# Patient Record
Sex: Female | Born: 1950 | Race: White | Hispanic: No | Marital: Single | Smoking: Former smoker
Health system: Southern US, Community
[De-identification: ages and names within clinical notes are randomized; demographics above are authoritative.]

## PROBLEM LIST (undated history)

## (undated) DIAGNOSIS — J449 Chronic obstructive pulmonary disease, unspecified: Secondary | ICD-10-CM

## (undated) DIAGNOSIS — F419 Anxiety disorder, unspecified: Secondary | ICD-10-CM

## (undated) DIAGNOSIS — E039 Hypothyroidism, unspecified: Secondary | ICD-10-CM

## (undated) DIAGNOSIS — IMO0001 Reserved for inherently not codable concepts without codable children: Secondary | ICD-10-CM

## (undated) DIAGNOSIS — E119 Type 2 diabetes mellitus without complications: Secondary | ICD-10-CM

## (undated) DIAGNOSIS — Z794 Long term (current) use of insulin: Secondary | ICD-10-CM

## (undated) DIAGNOSIS — R0789 Other chest pain: Secondary | ICD-10-CM

## (undated) DIAGNOSIS — E785 Hyperlipidemia, unspecified: Secondary | ICD-10-CM

## (undated) DIAGNOSIS — I2510 Atherosclerotic heart disease of native coronary artery without angina pectoris: Secondary | ICD-10-CM

## (undated) DIAGNOSIS — Z9002 Acquired absence of larynx: Secondary | ICD-10-CM

## (undated) DIAGNOSIS — Z87891 Personal history of nicotine dependence: Secondary | ICD-10-CM

## (undated) HISTORY — PX: OTHER SURGICAL HISTORY: SHX169

## (undated) HISTORY — DX: Atherosclerotic heart disease of native coronary artery without angina pectoris: I25.10

## (undated) HISTORY — DX: Type 2 diabetes mellitus without complications: E11.9

## (undated) HISTORY — DX: Hyperlipidemia, unspecified: E78.5

## (undated) HISTORY — DX: Personal history of nicotine dependence: Z87.891

## (undated) HISTORY — PX: LARYNGECTOMY: SUR815

## (undated) HISTORY — DX: Chronic obstructive pulmonary disease, unspecified: J44.9

## (undated) HISTORY — DX: Long term (current) use of insulin: Z79.4

## (undated) HISTORY — DX: Acquired absence of larynx: Z90.02

## (undated) HISTORY — DX: Other chest pain: R07.89

## (undated) HISTORY — DX: Hypothyroidism, unspecified: E03.9

## (undated) HISTORY — DX: Reserved for inherently not codable concepts without codable children: IMO0001

## (undated) HISTORY — DX: Anxiety disorder, unspecified: F41.9

---

## 1998-04-16 ENCOUNTER — Inpatient Hospital Stay (HOSPITAL_COMMUNITY): Admission: AD | Admit: 1998-04-16 | Discharge: 1998-04-17 | Payer: Self-pay | Admitting: *Deleted

## 2001-08-30 ENCOUNTER — Ambulatory Visit (HOSPITAL_COMMUNITY): Admission: RE | Admit: 2001-08-30 | Discharge: 2001-08-30 | Payer: Self-pay | Admitting: General Surgery

## 2001-08-30 ENCOUNTER — Encounter: Payer: Self-pay | Admitting: General Surgery

## 2003-03-06 ENCOUNTER — Encounter: Payer: Self-pay | Admitting: General Surgery

## 2003-03-06 ENCOUNTER — Ambulatory Visit (HOSPITAL_COMMUNITY): Admission: RE | Admit: 2003-03-06 | Discharge: 2003-03-06 | Payer: Self-pay | Admitting: General Surgery

## 2003-03-29 ENCOUNTER — Ambulatory Visit (HOSPITAL_COMMUNITY): Admission: RE | Admit: 2003-03-29 | Discharge: 2003-03-29 | Payer: Self-pay | Admitting: Internal Medicine

## 2004-09-18 ENCOUNTER — Ambulatory Visit: Payer: Self-pay | Admitting: Cardiology

## 2004-09-23 ENCOUNTER — Ambulatory Visit: Payer: Self-pay | Admitting: Cardiology

## 2005-03-04 ENCOUNTER — Ambulatory Visit (HOSPITAL_COMMUNITY): Admission: RE | Admit: 2005-03-04 | Discharge: 2005-03-04 | Payer: Self-pay | Admitting: Internal Medicine

## 2005-03-04 ENCOUNTER — Ambulatory Visit: Payer: Self-pay | Admitting: Internal Medicine

## 2005-03-20 ENCOUNTER — Ambulatory Visit: Payer: Self-pay | Admitting: Cardiology

## 2005-04-10 ENCOUNTER — Inpatient Hospital Stay (HOSPITAL_BASED_OUTPATIENT_CLINIC_OR_DEPARTMENT_OTHER): Admission: RE | Admit: 2005-04-10 | Discharge: 2005-04-10 | Payer: Self-pay | Admitting: Cardiology

## 2005-04-10 ENCOUNTER — Ambulatory Visit (HOSPITAL_COMMUNITY): Admission: RE | Admit: 2005-04-10 | Discharge: 2005-04-11 | Payer: Self-pay | Admitting: Cardiology

## 2005-04-10 ENCOUNTER — Ambulatory Visit: Payer: Self-pay | Admitting: Cardiology

## 2005-04-20 ENCOUNTER — Ambulatory Visit: Payer: Self-pay | Admitting: Cardiology

## 2005-04-21 ENCOUNTER — Ambulatory Visit: Payer: Self-pay | Admitting: Cardiology

## 2005-04-28 ENCOUNTER — Ambulatory Visit: Payer: Self-pay | Admitting: Cardiology

## 2005-08-12 ENCOUNTER — Ambulatory Visit: Payer: Self-pay | Admitting: *Deleted

## 2005-08-12 ENCOUNTER — Inpatient Hospital Stay (HOSPITAL_COMMUNITY): Admission: AD | Admit: 2005-08-12 | Discharge: 2005-08-14 | Payer: Self-pay | Admitting: Internal Medicine

## 2005-08-17 ENCOUNTER — Ambulatory Visit: Payer: Self-pay | Admitting: Cardiology

## 2005-08-19 ENCOUNTER — Ambulatory Visit: Payer: Self-pay | Admitting: Cardiology

## 2005-09-04 ENCOUNTER — Ambulatory Visit (HOSPITAL_COMMUNITY): Admission: RE | Admit: 2005-09-04 | Discharge: 2005-09-04 | Payer: Self-pay | Admitting: Pulmonary Disease

## 2005-12-22 ENCOUNTER — Ambulatory Visit: Payer: Self-pay | Admitting: Cardiology

## 2006-02-17 ENCOUNTER — Ambulatory Visit: Payer: Self-pay | Admitting: Cardiology

## 2006-04-21 ENCOUNTER — Ambulatory Visit (HOSPITAL_COMMUNITY): Admission: RE | Admit: 2006-04-21 | Discharge: 2006-04-21 | Payer: Self-pay | Admitting: Otolaryngology

## 2006-05-18 ENCOUNTER — Ambulatory Visit (HOSPITAL_COMMUNITY): Admission: RE | Admit: 2006-05-18 | Discharge: 2006-05-18 | Payer: Self-pay | Admitting: Pulmonary Disease

## 2006-06-18 ENCOUNTER — Ambulatory Visit: Payer: Self-pay | Admitting: Cardiology

## 2006-08-17 ENCOUNTER — Ambulatory Visit: Payer: Self-pay | Admitting: Cardiology

## 2006-09-20 ENCOUNTER — Ambulatory Visit (HOSPITAL_COMMUNITY): Admission: RE | Admit: 2006-09-20 | Discharge: 2006-09-20 | Payer: Self-pay | Admitting: Pulmonary Disease

## 2006-10-04 ENCOUNTER — Ambulatory Visit: Payer: Self-pay | Admitting: Internal Medicine

## 2006-10-11 ENCOUNTER — Ambulatory Visit (HOSPITAL_COMMUNITY): Admission: RE | Admit: 2006-10-11 | Discharge: 2006-10-11 | Payer: Self-pay | Admitting: Internal Medicine

## 2006-10-11 ENCOUNTER — Ambulatory Visit: Payer: Self-pay | Admitting: Internal Medicine

## 2006-10-11 ENCOUNTER — Encounter (INDEPENDENT_AMBULATORY_CARE_PROVIDER_SITE_OTHER): Payer: Self-pay | Admitting: Specialist

## 2006-10-26 ENCOUNTER — Encounter (HOSPITAL_COMMUNITY): Admission: RE | Admit: 2006-10-26 | Discharge: 2006-11-25 | Payer: Self-pay | Admitting: Pulmonary Disease

## 2006-10-26 ENCOUNTER — Ambulatory Visit (HOSPITAL_COMMUNITY): Payer: Self-pay | Admitting: Pulmonary Disease

## 2006-11-26 ENCOUNTER — Ambulatory Visit: Payer: Self-pay | Admitting: Cardiology

## 2006-12-01 ENCOUNTER — Encounter (INDEPENDENT_AMBULATORY_CARE_PROVIDER_SITE_OTHER): Payer: Self-pay | Admitting: Otolaryngology

## 2006-12-01 ENCOUNTER — Ambulatory Visit (HOSPITAL_COMMUNITY): Admission: RE | Admit: 2006-12-01 | Discharge: 2006-12-01 | Payer: Self-pay | Admitting: Otolaryngology

## 2007-01-06 ENCOUNTER — Ambulatory Visit (HOSPITAL_COMMUNITY): Admission: RE | Admit: 2007-01-06 | Discharge: 2007-01-06 | Payer: Self-pay | Admitting: Otolaryngology

## 2007-01-21 ENCOUNTER — Ambulatory Visit: Payer: Self-pay | Admitting: Cardiology

## 2007-04-13 ENCOUNTER — Ambulatory Visit: Admission: RE | Admit: 2007-04-13 | Discharge: 2007-06-27 | Payer: Self-pay | Admitting: Radiation Oncology

## 2007-06-15 ENCOUNTER — Encounter: Payer: Self-pay | Admitting: Cardiology

## 2007-07-04 ENCOUNTER — Ambulatory Visit: Payer: Self-pay | Admitting: Cardiology

## 2007-07-07 ENCOUNTER — Encounter: Payer: Self-pay | Admitting: Cardiology

## 2007-09-15 ENCOUNTER — Ambulatory Visit (HOSPITAL_COMMUNITY): Admission: RE | Admit: 2007-09-15 | Discharge: 2007-09-15 | Payer: Self-pay | Admitting: General Surgery

## 2007-10-21 ENCOUNTER — Ambulatory Visit: Payer: Self-pay | Admitting: Cardiology

## 2007-10-21 ENCOUNTER — Encounter: Payer: Self-pay | Admitting: Physician Assistant

## 2007-10-27 IMAGING — CT CT NECK W/ CM
3 series · 12 of 33 positions shown, 14 images · IV contrast (omnipaque)
Comparison: none

CLINICAL DATA: Recent diagnosis of laryngeal cancer.  Hoarseness, cough. Surgery on 12/01/06.
NECK CT WITH CONTRAST:
TECHNIQUE: Multidetector CT imaging of the neck was performed following the standard protocol during administration of intravenous contrast.
Contrast:  100 cc Omnipaque 300.

[Series 2: neck 3.0 b31s · axial · 0.39mm/px · z∈[-108,+36]mm · 4 of 68 slices shown, 5 images]
[im 11/68  soft-tissue]
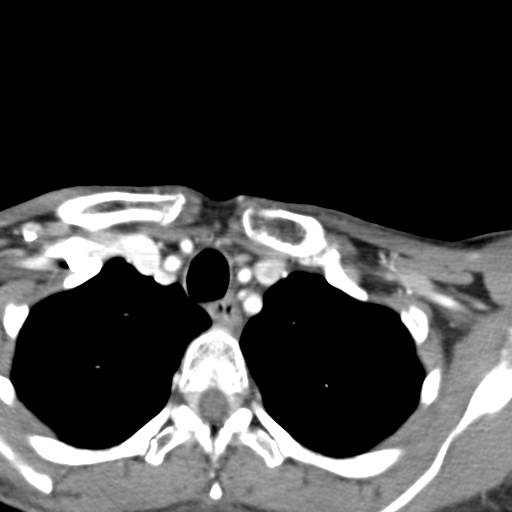
[im 11/68  bone]
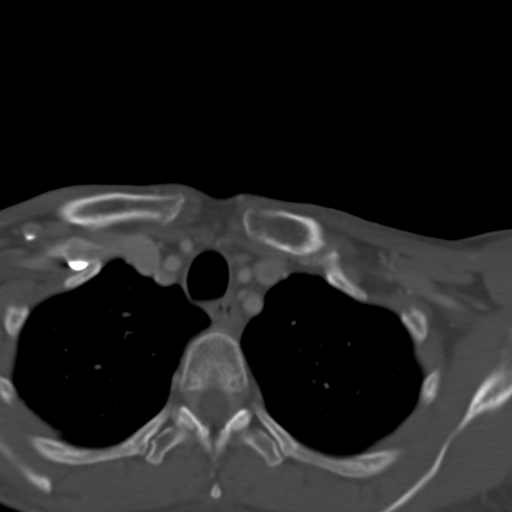
[im 26/68  bone]
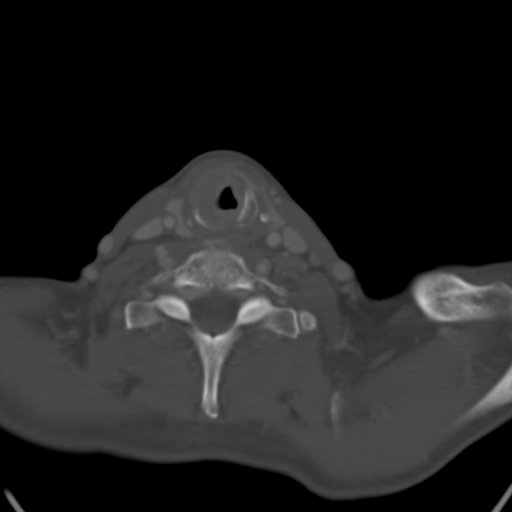
[im 42/68  bone]
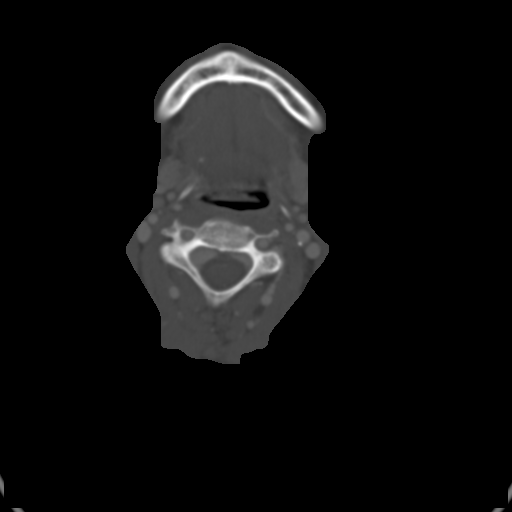
[im 57/68  bone]
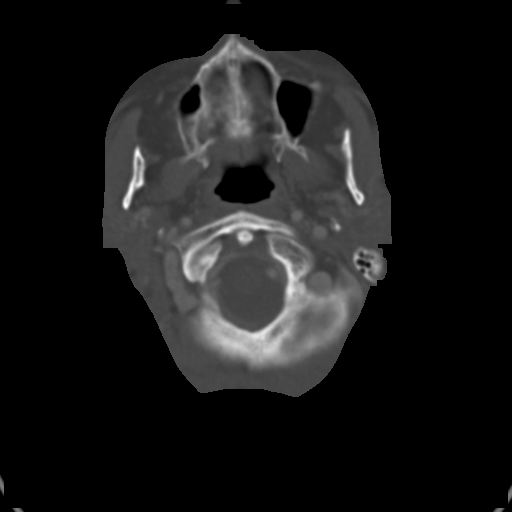

[Series 4: neck 3.0 spo cor · coronal · 0.22mm/px · 3 of 38 slices shown]
[im 8/38  bone]
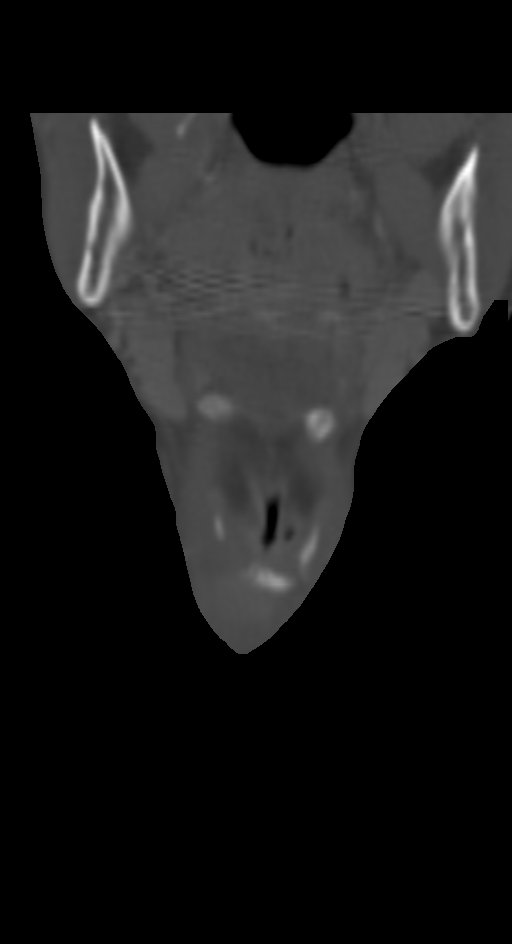
[im 15/38  bone]
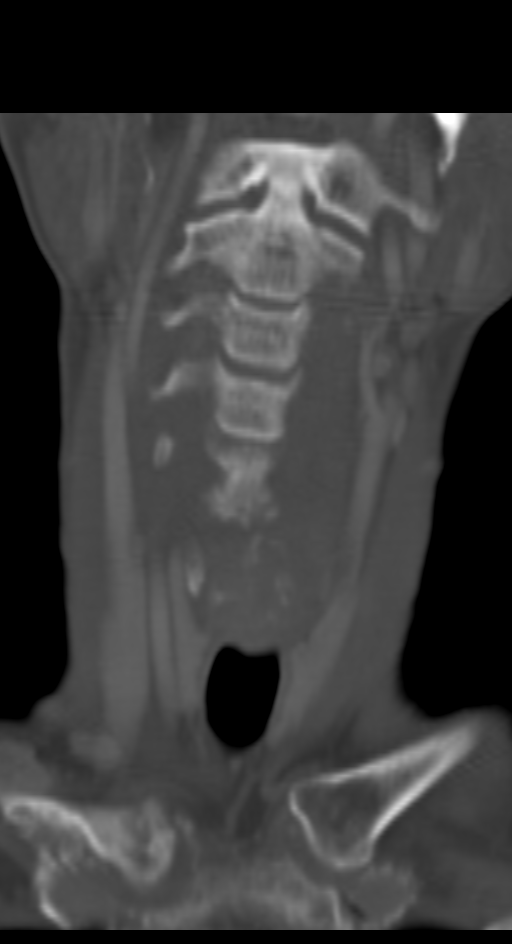
[im 23/38  bone]
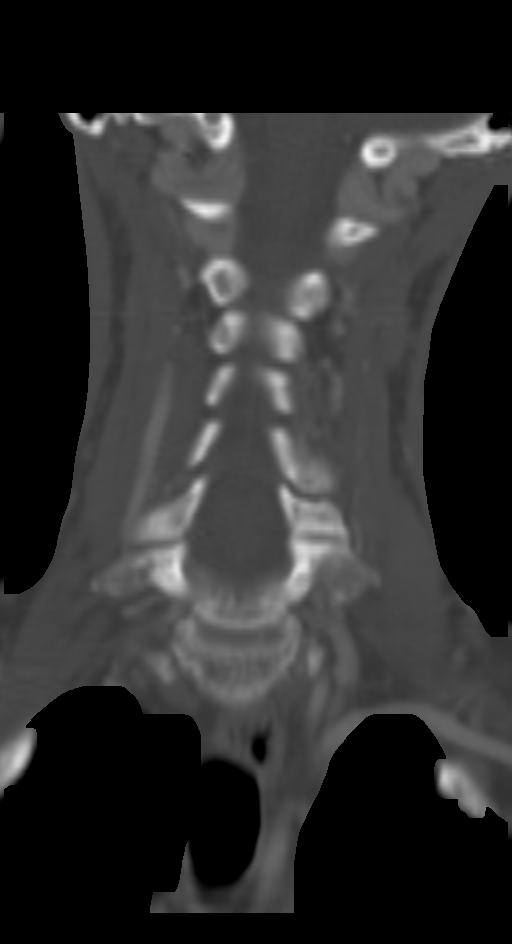

[Series 5: neck 3.0 spo sag · sagittal · 0.30mm/px · 5 of 45 slices shown, 6 images]
[im 15/45  bone]
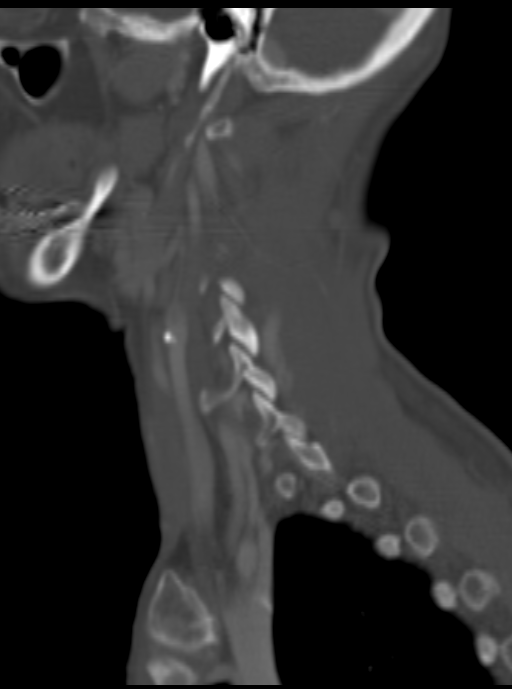
[im 19/45  bone]
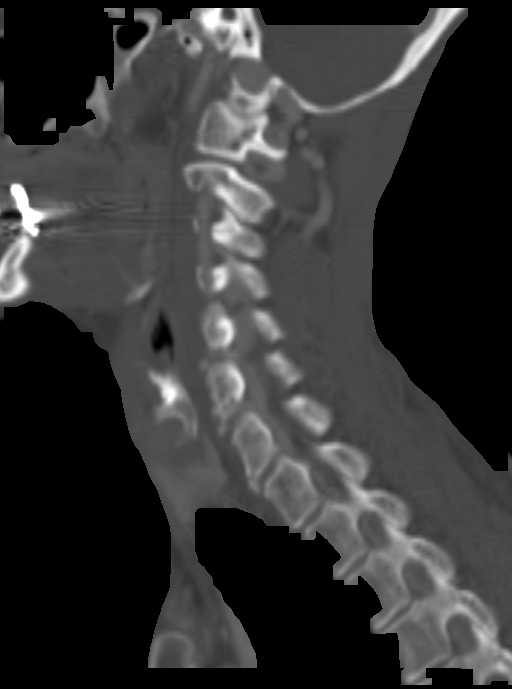
[im 23/45  soft-tissue]
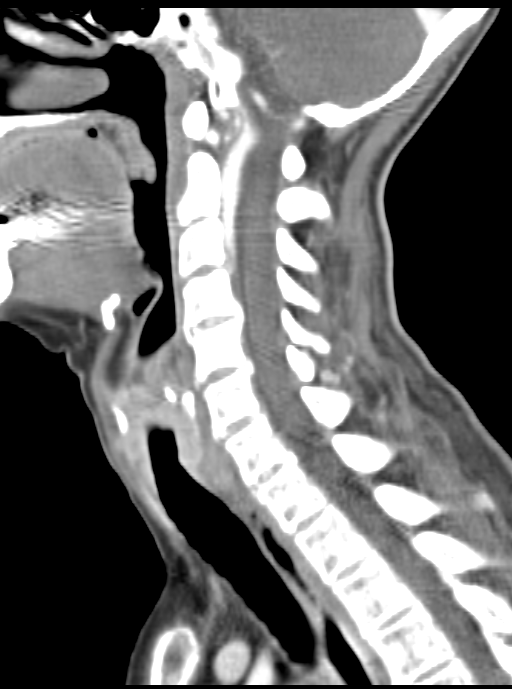
[im 23/45  bone]
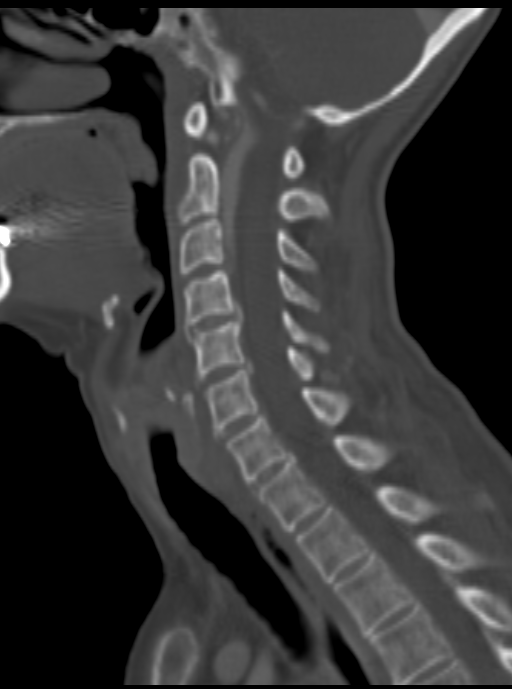
[im 26/45  bone]
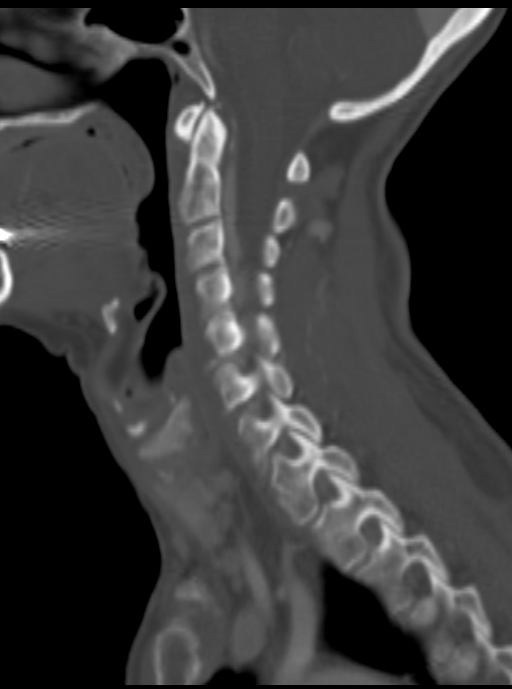
[im 30/45  bone]
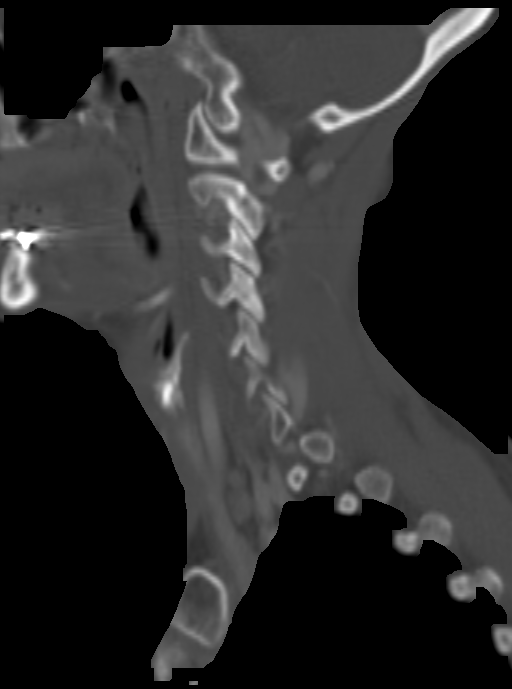

[12 of 33 positions shown; findings below may reference images not displayed]

FINDINGS: Visualized intracranial structures are unremarkable.  Paranasal sinuses, middle ears and mastoids are clear. Parotid glands and submandibular glands are normal. Thyroid gland is normal.  Lung apices show mild emphysematous changes without a focal lesion.
I do not see any enlarged lymph nodes on either side of the neck.  There is a 7 x 11 mm node on the left just lateral to the carotid artery.  On the right at this same level, there is a 6 x 11 mm lymph node.
At the glottic to subglottic region, there is abnormal thickening in the mucosal and submucosal region which is nearly circumferential.  Certainly this extends from [DATE] around clockwise to [DATE].  This includes the anterior commissure obviously.  Cricoid cartilage on the right appears to be destroyed.  I think there is probable invasion of the arytenoid on the right as well. The inferior thyroid cartilage is also involved.  The cephalocaudal extent of the tumor is measured at 3.2 cm.  
In the supraclavicular region in the low left neck, there is a lymph node that measure at 6 x 10 mm.  This is seen on image 53.
At the tongue base on the right on image 17 and 18, there is some asymmetric soft tissue density relative to the left. This could just be tonsillar tissue but direct inspection would be suggested to rule out a second primary.
IMPRESSION: 1.  Large (3 cm) glottic to subglottic tumor primarily on the right but extending nearly circumferentially from [DATE] to [DATE] in a clockwise direction. Definite destruction of the right side of the cricoid cartilage with involvement also the right side of the arytenoid and the right side of the thyroid cartilage.
2.  No pathologically enlarged nodes identified.  Largest lymph nodes in the neck are on the order of 6 x 12 mm in size which do not quality for pathologic enlargement. No sign of low density within them.
3.  Soft tissue prominence at the lingual tonsil region on the right. This may simply be asymmetric tonsillar tissue but direct inspection would be suggested to make sure we are not dealing with a synchronous primary.

## 2008-02-29 ENCOUNTER — Encounter: Payer: Self-pay | Admitting: Cardiology

## 2008-05-17 ENCOUNTER — Ambulatory Visit: Payer: Self-pay | Admitting: Cardiology

## 2008-07-04 ENCOUNTER — Encounter: Payer: Self-pay | Admitting: Cardiology

## 2008-07-05 ENCOUNTER — Ambulatory Visit: Payer: Self-pay | Admitting: Cardiology

## 2008-07-21 ENCOUNTER — Ambulatory Visit: Payer: Self-pay | Admitting: Cardiology

## 2008-07-30 ENCOUNTER — Encounter: Payer: Self-pay | Admitting: Cardiology

## 2008-10-22 ENCOUNTER — Ambulatory Visit: Payer: Self-pay | Admitting: Cardiology

## 2009-04-01 DIAGNOSIS — R079 Chest pain, unspecified: Secondary | ICD-10-CM | POA: Insufficient documentation

## 2009-04-01 DIAGNOSIS — E785 Hyperlipidemia, unspecified: Secondary | ICD-10-CM | POA: Insufficient documentation

## 2009-04-01 DIAGNOSIS — I2510 Atherosclerotic heart disease of native coronary artery without angina pectoris: Secondary | ICD-10-CM | POA: Insufficient documentation

## 2009-04-15 ENCOUNTER — Encounter: Payer: Self-pay | Admitting: Cardiology

## 2009-04-18 ENCOUNTER — Encounter: Payer: Self-pay | Admitting: Physician Assistant

## 2009-04-30 ENCOUNTER — Encounter: Payer: Self-pay | Admitting: Cardiology

## 2009-04-30 DIAGNOSIS — C329 Malignant neoplasm of larynx, unspecified: Secondary | ICD-10-CM

## 2009-04-30 DIAGNOSIS — I259 Chronic ischemic heart disease, unspecified: Secondary | ICD-10-CM | POA: Insufficient documentation

## 2009-04-30 DIAGNOSIS — J4489 Other specified chronic obstructive pulmonary disease: Secondary | ICD-10-CM | POA: Insufficient documentation

## 2009-04-30 DIAGNOSIS — J449 Chronic obstructive pulmonary disease, unspecified: Secondary | ICD-10-CM

## 2009-04-30 DIAGNOSIS — R0789 Other chest pain: Secondary | ICD-10-CM

## 2009-05-01 ENCOUNTER — Ambulatory Visit: Payer: Self-pay | Admitting: Cardiology

## 2009-09-16 ENCOUNTER — Ambulatory Visit: Payer: Self-pay | Admitting: Cardiology

## 2010-05-05 ENCOUNTER — Encounter: Payer: Self-pay | Admitting: Cardiology

## 2010-05-05 ENCOUNTER — Ambulatory Visit: Payer: Self-pay | Admitting: Cardiology

## 2010-05-15 ENCOUNTER — Encounter: Payer: Self-pay | Admitting: Orthopedic Surgery

## 2010-07-09 ENCOUNTER — Telehealth (INDEPENDENT_AMBULATORY_CARE_PROVIDER_SITE_OTHER): Payer: Self-pay | Admitting: *Deleted

## 2010-07-19 ENCOUNTER — Encounter: Payer: Self-pay | Admitting: Pulmonary Disease

## 2010-07-20 ENCOUNTER — Encounter: Payer: Self-pay | Admitting: *Deleted

## 2010-07-20 ENCOUNTER — Encounter: Payer: Self-pay | Admitting: General Surgery

## 2010-07-20 ENCOUNTER — Encounter: Payer: Self-pay | Admitting: Pulmonary Disease

## 2010-07-29 NOTE — Assessment & Plan Note (Signed)
Summary: [facility]      Allergies Added:   Visit Type:  Follow-up Primary Azhar Yogi:  Juanetta Gosling   History of Present Illness: the patient is a 60 year old female with laryngeal cancer status post tracheotomy. She is a history of single-vessel coronary arterydiseas with stent to the right coronary artery 2007. She had a nonischemic Cardiolite in 2008. She has done very well from a cardiac perspective. She denies any substernal chest pain or exertional dyspnea. She does cough a lot with her  tracheostomy but denies any fever chills or yellow phlegm. She is unable to take statins secondary to side effects. Her cardiovascular standpoint has otherwise remained stable. The patient is declining a stress test  at the present time.  Preventive Screening-Counseling & Management  Alcohol-Tobacco     Smoking Status: quit     Year Quit: 2008  Current Medications (verified): 1)  Metoprolol Succinate 25 Mg Xr24h-Tab (Metoprolol Succinate) .... Take One Tablet By Mouth Daily As Needed 2)  Nexium 20 Mg Cpdr (Esomeprazole Magnesium) .... Take 1 Tablet By Mouth Once A Day 3)  Alprazolam 0.5 Mg Tabs (Alprazolam) .... Take 1 Tablet By Mouth 5  Times A Day As Needed 4)  Humulin N 100 Unit/ml Susp (Insulin Isophane Human) .... Use As Directed 5)  Aspirin 81 Mg Tbec (Aspirin) .... Take 1 Tablet By Mouth Once A Day  Allergies (verified): 1)  ! Pcn 2)  ! Codeine 3)  ! Plavix (Clopidogrel Bisulfate) 4)  ! Lipitor (Atorvastatin)  Comments:  Nurse/Medical Assistant: The patient is currently on medications but does not know the name or dosage at this time. Instructed to contact our office with details. Will update medication list at that time.  Past History:  Past Medical History: Last updated: 04/30/2009  1. Atypical chest pain, likely musculoskeletal.   2. Single-vessel coronary artery disease.       a.     Status post Cypher stent of the right coronary artery        October 2006 and patent by  catheterization October 2007.       b.     Normal left ventricular function.       c.     Normal exercise Cardiolite study of 67% May 2008.   3. Chronic obstructive pulmonary disease and history of tobacco use,       discontinued.   4. Dyslipidemia.   5. Insulin-dependent diabetes mellitus.   6. 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   7. Anxiety disorder.   8. Hypothyroidism.   Past Surgical History: Last updated: 04/01/2009 laryngectomy permanent tracheostomy  Family History: Last updated: 05/01/2009 Negative FH of Diabetes, Hypertension, or Coronary Artery Disease  Social History: Last updated: 04/01/2009 Single  Tobacco Use - No.   Risk Factors: Smoking Status: quit (05/05/2010)  Review of Systems       The patient complains of prolonged cough.  The patient denies fatigue, malaise, fever, weight gain/loss, vision loss, decreased hearing, hoarseness, chest pain, palpitations, shortness of breath, wheezing, sleep apnea, coughing up blood, abdominal pain, blood in stool, nausea, vomiting, diarrhea, heartburn, incontinence, blood in urine, muscle weakness, joint pain, leg swelling, rash, skin lesions, headache, fainting, dizziness, depression, anxiety, enlarged lymph nodes, easy bruising or bleeding, and environmental allergies.    Vital Signs:  Patient profile:   60 year old female Height:      62 inches Weight:      97 pounds BMI:  17.81 Pulse rate:   93 / minute BP sitting:   153 / 89  (left arm) Cuff size:   regular  Vitals Entered By: Carlye Grippe (May 05, 2010 1:22 PM)  Physical Exam  Additional Exam:  General: Well-developed, well-nourished in no distress head: Normocephalic and atraumatic eyes PERRLA/EOMI intact, conjunctiva and lids normal nose: No deformity or lesions mouth normal dentition, normal posterior pharynx neck: Supple, no JVD.  No masses, thyromegaly or abnormal cervical nodes. Status  post tracheotomy. lungs: Normal breath sounds bilaterally without wheezing.  Normal percussion heart: regular rate and rhythm with normal S1 and S2, no S3 or S4.  PMI is normal.  No pathological murmurs abdomen: Normal bowel sounds, abdomen is soft and nontender without masses, organomegaly or hernias noted.  No hepatosplenomegaly musculoskeletal: Back normal, normal gait muscle strength and tone normal pulsus: Pulse is normal in all 4 extremities Extremities: No peripheral pitting edema neurologic: Alert and oriented x 3 skin: Intact without lesions or rashes cervical nodes: No significant adenopathy psychologic: Normal affect    EKG  Procedure date:  05/05/2010  Findings:      normal sinus rhythm no acute ischemic changes.  Impression & Recommendations:  Problem # 1:  COPD (ICD-496) stable. Chronic cough but no active infection  Problem # 2:  TRACHEOSTOMY STATUS (ICD-V44.0) Assessment: Comment Only  Problem # 3:  CORONARY ARTERY DISEASE, S/P PTCA (ICD-414.9) the patient status post prior stenting for single vessel disease. She denies any chest pain. She's due for ischemia testing but the patient wants to hold off. She cannot take statin drug therapy. Her updated medication list for this problem includes:    Metoprolol Succinate 25 Mg Xr24h-tab (Metoprolol succinate) .Marland Kitchen... Take one tablet by mouth daily as needed    Aspirin 81 Mg Tbec (Aspirin) .Marland Kitchen... Take 1 tablet by mouth once a day  Other Orders: EKG w/ Interpretation (93000)  Patient Instructions: 1)  Your physician recommends that you continue on your current medications as directed. Please refer to the Current Medication list given to you today. 2)  Follow up in  6 months

## 2010-07-29 NOTE — Assessment & Plan Note (Signed)
Summary: 3 month fu -  Medications Added ALPRAZOLAM 0.5 MG TABS (ALPRAZOLAM) Take 1 tablet by mouth 5  times a day as needed ASPIRIN 81 MG TBEC (ASPIRIN) Take 1 tablet by mouth once a day        Visit Type:  Follow-up Primary Provider:  Juanetta Gosling  CC:  follow-up visit.  History of Present Illness: the patient's 60-year-old female status post tracheotomy secondary to laryngeal cancer. She also has single-vessel coronary disease with stent of right coronary artery. Stent was patent in 2007 and she had a nonischemic Cardiolite in 2008. The patient reports nosubsternal chest pain short of breath and PND.she does have productive nonpurulent cough which is chronic. Unfortunately she stopped taking her aspirin because of stomach upset. Chest significant reflux also. She reports no exertional chest pain. She feels panicky today. She feels it's too hot in the office and this makes her anxious.  Preventive Screening-Counseling & Management  Alcohol-Tobacco     Smoking Status: quit     Year Quit: 2008  Clinical Review Panels:  CXR CXR results Cardiac silhouette is upper normal size.  Along the left         side of the inferior cardiac margin is an oval opacity which is         felt represent the nipple density.  No pulmonary edema, pneumonia,         or pleural effusion is seen.  Nonaneurysmal aortic calcification is         present.  No bony lesions are demonstrated.                   IMPRESSION:         No acute or active process is seen. (07/04/2008)    Current Medications (verified): 1)  Metoprolol Succinate 25 Mg Xr24h-Tab (Metoprolol Succinate) .... Take One Tablet By Mouth Daily As Needed 2)  Nexium 20 Mg Cpdr (Esomeprazole Magnesium) .... Take 1 Tablet By Mouth Once A Day 3)  Alprazolam 0.5 Mg Tabs (Alprazolam) .... Take 1 Tablet By Mouth 5  Times A Day As Needed 4)  Humulin N 100 Unit/ml Susp (Insulin Isophane Human) .... Use As Directed 5)  Aspirin 81 Mg Tbec (Aspirin) .... Take  1 Tablet By Mouth Once A Day  Allergies: 1)  ! Pcn 2)  ! Codeine 3)  ! Plavix (Clopidogrel Bisulfate) 4)  ! Lipitor (Atorvastatin)  Comments:  Nurse/Medical Assistant: The patient's medications and allergies were reviewed with the patient and were updated in the Medication and Allergy Lists. Youlanda Mighty RN (September 16, 2009 3:12 PM)  Past History:  Past Medical History: Last updated: 04/30/2009  1. Atypical chest pain, likely musculoskeletal.   2. Single-vessel coronary artery disease.       a.     Status post Cypher stent of the right coronary artery        October 2006 and patent by catheterization October 2007.       b.     Normal left ventricular function.       c.     Normal exercise Cardiolite study of 67% May 2008.   3. Chronic obstructive pulmonary disease and history of tobacco use,       discontinued.   4. Dyslipidemia.   5. Insulin-dependent diabetes mellitus.   6. 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   7. Anxiety disorder.   8. Hypothyroidism.   Past Surgical History: Last  updated: 04/01/2009 laryngectomy permanent tracheostomy  Family History: Last updated: 05/01/2009 Negative FH of Diabetes, Hypertension, or Coronary Artery Disease  Social History: Last updated: 04/01/2009 Single  Tobacco Use - No.   Risk Factors: Smoking Status: quit (09/16/2009)  Review of Systems       The patient complains of prolonged cough.  The patient denies fatigue, malaise, fever, weight gain/loss, vision loss, decreased hearing, hoarseness, chest pain, palpitations, shortness of breath, wheezing, sleep apnea, coughing up blood, abdominal pain, blood in stool, nausea, vomiting, diarrhea, heartburn, incontinence, blood in urine, muscle weakness, joint pain, leg swelling, rash, skin lesions, headache, fainting, dizziness, depression, anxiety, enlarged lymph nodes, easy bruising or bleeding, and environmental allergies.     Vital Signs:  Patient profile:   60 year old female Height:      62 inches Weight:      97 pounds O2 Sat:      97 % Pulse rate:   103 / minute BP sitting:   135 / 78  (left arm) Cuff size:   regular  Vitals Entered By: Youlanda Mighty RN (September 16, 2009 3:10 PM) CC: follow-up visit   Physical Exam  Additional Exam:  General: Well-developed, well-nourished in no distress head: Normocephalic and atraumatic eyes PERRLA/EOMI intact, conjunctiva and lids normal nose: No deformity or lesions mouth normal dentition, normal posterior pharynx neck: Supple, no JVD.  No masses, thyromegaly or abnormal cervical nodes. Status post tracheotomy. lungs: Normal breath sounds bilaterally without wheezing.  Normal percussion heart: regular rate and rhythm with normal S1 and S2, no S3 or S4.  PMI is normal.  No pathological murmurs abdomen: Normal bowel sounds, abdomen is soft and nontender without masses, organomegaly or hernias noted.  No hepatosplenomegaly musculoskeletal: Back normal, normal gait muscle strength and tone normal pulsus: Pulse is normal in all 4 extremities Extremities: No peripheral pitting edema neurologic: Alert and oriented x 3 skin: Intact without lesions or rashes cervical nodes: No significant adenopathy psychologic: Normal affect    Impression & Recommendations:  Problem # 1:  TRACHEOSTOMY STATUS (ICD-V44.0) Assessment Comment Only  Problem # 2:  CHEST PAIN, ATYPICAL (ICD-786.59) Assessment: Comment Only  The following medications were removed from the medication list:    Aspirin 81 Mg Tbec (Aspirin) .Marland Kitchen... Take 1 tablet by mouth once a day Her updated medication list for this problem includes:    Metoprolol Succinate 25 Mg Xr24h-tab (Metoprolol succinate) .Marland Kitchen... Take one tablet by mouth daily as needed    Aspirin 81 Mg Tbec (Aspirin) .Marland Kitchen... Take 1 tablet by mouth once a day  Problem # 3:  CORONARY ARTERY DISEASE, S/P PTCA (ICD-414.9) the patient stopped  taking aspirin. I told her she cannot discontinued this and recommended enteric-coated aspirin 81 mg a day. I explained to her that she's otherwise at high risk for late stent thrombosis. I also told her that she can increase Nexium to twice a day if needed The following medications were removed from the medication list:    Aspirin 81 Mg Tbec (Aspirin) .Marland Kitchen... Take 1 tablet by mouth once a day Her updated medication list for this problem includes:    Metoprolol Succinate 25 Mg Xr24h-tab (Metoprolol succinate) .Marland Kitchen... Take one tablet by mouth daily as needed    Aspirin 81 Mg Tbec (Aspirin) .Marland Kitchen... Take 1 tablet by mouth once a day  Problem # 4:  HYPERLIPIDEMIA-MIXED (ICD-272.4) Assessment: Comment Only  Patient Instructions: 1)  Need to continue the Enteric Coated Aspirin 81mg  daily 2)  Follow up in  6 months.

## 2010-07-29 NOTE — Letter (Signed)
Summary: Medical record reques [employer]  Medical record reques [employer]   Imported By: Cammie Sickle 05/23/2010 13:57:17  _____________________________________________________________________  External Attachment:    Type:   Image     Comment:   External Document

## 2010-08-01 ENCOUNTER — Encounter: Payer: Self-pay | Admitting: Cardiology

## 2010-08-06 NOTE — Progress Notes (Signed)
Summary: ?? stress test or echo       Phone Note Call from Patient   Summary of Call: Seen last in November and MD suggested ishemia testing, but patient declined to do at that time.  Patient states that she has had a couple of pains over left chest.  Stated that she did see Dr. Juanetta Gosling and he thought that it was muscle related.  Wants to know if echo would be okay for her to have instead of stress test.  Worried about testing not being good because of her having sooo much anxiety leading up to test.  Thinks heart rate will be too fast.   Initial call taken by: Hoover Brunette, LPN,  July 09, 2010 4:32 PM  Follow-up for Phone Call        she really needs a stress test. The Lexiscan should not give her an increased heart rate and is a very short procedure. Would recommend we ordered this Follow-up by: Lewayne Bunting, MD, [facility],  July 09, 2010 6:04 PM  Additional Follow-up for Phone Call Additional follow up Details #1::        Left message to return call.      Additional Follow-up for Phone Call Additional follow up Details #2::    patient returned your call. Follow-up by: Carlye Grippe,  July 15, 2010 12:50 PM  Additional Follow-up for Phone Call Additional follow up Details #3:: Details for Additional Follow-up Action Taken: Left message to return call.  Hoover Brunette, LPN  July 22, 2010 3:11 PM  Patient notified.  States that she does not want to have stress test with drug (Lexiscan).  She will do the walking on the treadmill.  Her concern over her elevated heart rate & BP is not only during the test, but just the anxiety of getting there.   She c/o of having extreme anxiety when she goes out anywhere.  Want pt to come in for OV to discuss further?   exercise Cardiolite is fine provided that she holds beta-blocker 48 hours before testing.  She could also do a dobutamine echocardiogram and again beta-blocker will need to be held 48 hours prior. Lewayne Bunting, MD, [facility]  July 28, 2010  1:23 PM  Additional Follow-up by: Hoover Brunette, LPN,  July 28, 2010 10:13 AM  Discussed above with patient.  Will call back with date she can go.  Will try to get friend to go with her. Hoover Brunette, LPN  July 30, 2010 3:20 PM   Appended Document: ?? stress test or echo     Received call back from pt.  - will try to get scheduled for 2/10.  Neighbor will go with her.

## 2010-08-06 NOTE — Miscellaneous (Signed)
Summary: Orders Update - exercise cardiolite   Clinical Lists Changes  Orders: Added new Referral order of Nuclear Med (Nuc Med) - Signed

## 2010-11-04 ENCOUNTER — Telehealth: Payer: Self-pay | Admitting: *Deleted

## 2010-11-04 NOTE — Telephone Encounter (Signed)
Message left on voice mail to return call - wants to ask a question.  Returned call to patient - left message.

## 2010-11-06 NOTE — Telephone Encounter (Signed)
Left message for patient to call office.  

## 2010-11-11 NOTE — Letter (Signed)
Nov 23, 2006     RE:  [name], [name]  MRN:  [id number]  /  DOB:  1951/01/05   To Whom It May Concern:   This is a letter for medical clearance for microlaryngoscopy.   Mrs. Mehlhaff is a patient of mine.  She is a 60 year old female with  history of single vessel coronary artery disease status post RCA in  2006.  The patient has been stable.  She reports no recurrence of  sternal chest pain.  She also had Cardiolite stress study done in March  2006 with no evidence of ischemia.  The patient is scheduled for ENT  surgery.  From a cardiovascular perspective, the patient appears to be  in stable condition and the procedure should be able to be performed at  low cardiovascular risk.   If you have any further questions, please do not hesitate to contact me.    Sincerely,      Learta Codding, MD,FACC  Electronically Signed    GED/MedQ  DD: 11/23/2006  DT: 11/23/2006  Job #: [id number]

## 2010-11-11 NOTE — Assessment & Plan Note (Signed)
[facility]                          [facility] OFFICE NOTE   Sabrina, Rhodes                   MRN:          [id number]  DATE:07/05/2008                            DOB:          July 29, 1950    PRIMARY CARDIOLOGIST:  Learta Codding, MD, [facility]   REASON FOR VISIT:  Scheduled followup.   [name] has essentially been doing well since her last visit here  in November, with Dr. Andee Lineman.  However, she suggests that she becomes  moderately hypertensive on days that she has to see a physician.  Consequently, she ended up in the emergency room yesterday, after  presenting to her local pharmacy with her blood pressure readings of  approximately 170 systolic, and with complaint of dizziness.  She also  complained of palpitations, yet had no evidence of dysrhythmia during  her brief stay in the emergency room here at [facility].  An EKG  demonstrated NSR at 83 bpm with normal axis and nonspecific ST changes.  Blood work was within normal limits, including one set of cardiac  markers.  She presented to the emergency room with a blood pressure of  152/90 and a pulse of 82.  Chest x-ray was also normal.  She received a  one-time dose of 2 mg of Ativan, and was subsequently cleared for  discharge.   In summary, [name] denies any interim development of exertional  angina pectoris.  She also states that her blood pressure is otherwise  quite stable, and presents to me a reading of 92/68 from this morning.  However, this increased to 156/86 as she was preparing to come for  today's visit.  As in the past, she takes a quarter tablet of Toprol-XL  25 mg, for p.r.n. treatment of elevated blood pressure.   CURRENT MEDICATIONS:  1. Toprol-XL 25 mg one-quarter tablet p.r.n.  2. Xanax 0.5 mg q.i.d. p.r.n.  3. Nexium.  4. Levothyroxine 0.075 mg daily.  5. Humulin N 12 units q.a.m./2 units q.p.m.   PHYSICAL EXAMINATION:  VITAL SIGNS:  Blood pressure  141/80, pulse 80,  regular weight 102 (up 7).  GENERAL:  A 60 year old female, sitting upright, no distress.  HEENT:  Normocephalic and atraumatic.  NECK:  Palpable carotid pulse without bruits; no JVD.  LUNGS:  Clear to auscultation bilaterally.  HEART:  Regular rate and rhythm.  No significant murmurs.  No rubs.  ABDOMEN:  Benign.  EXTREMITIES:  No significant edema.  NEUROLOGIC:  Alert and oriented.   IMPRESSION:  1. Longstanding palpitations.  2. Single-vessel CAD.      a.     Status post Cypher stenting of RCA, October 2006; widely       patent by cardiac catheterization, February 2007.      b.     Normal LVF.      c.     Normal exercise stress Cardiolite; EF 67%, May 2008.  3. COPD/history of tobacco.  4. Dyslipidemia.      a.     Intolerant to statins, as well as Zetia.  5. Insulin-dependent diabetes mellitus.  6. Status post laryngectomy/permanent tracheostomy.  a.     History of laryngeal cancer.  7. Anxiety disorder.  8. Hypothyroidism.   PLAN:  1. The patient has agreed to wear a CardioNet monitor for 1 week, to      fully exclude any significant dysrhythmias.  2. The patient has agreed to start omega-3 fish oil for treatment of      her dyslipidemia, given her stated intolerance to statins, as well      as Zetia.  We will start her on 1 capsule b.i.d.  She will need a      followup fasting lipid/liver profile in 12 weeks.  3. The patient is to follow up with Dr. Kari Baars on July 18, 2008, as scheduled.  She can review with him her latest TSH level,      and whether or not she needs further adjustment of her current dose      of Synthroid.  4. Schedule return clinic followup with myself and Dr. Andee Lineman in 1      month, for review of monitor results and further recommendations.      Gene Serpe, PA-C  Electronically Signed      Learta Codding, MD,FACC  Electronically Signed   GS/MedQ  DD: 07/05/2008  DT: 07/06/2008  Job #: [id number]   cc:    Ramon Dredge L. Juanetta Gosling, M.D.

## 2010-11-11 NOTE — Op Note (Signed)
[name], [name]            ACCOUNT NO.:  [id number]   MEDICAL RECORD NO.:  [id number]          PATIENT TYPE:  AMB   LOCATION:  SDS                          FACILITY:  [facility]   PHYSICIAN:  Jefry H. Pollyann Kennedy, MD     DATE OF BIRTH:  29-Jun-1951   DATE OF PROCEDURE:  12/01/2006  DATE OF DISCHARGE:  12/01/2006                               OPERATIVE REPORT   PREOPERATIVE DIAGNOSIS:  Laryngeal mass with hoarseness.   POSTOPERATIVE DIAGNOSIS:  Laryngeal mass with hoarseness.   PROCEDURE:  Microlaryngoscopy with biopsy and laser excision of  laryngeal mass.   SURGEON:  Jefry H. Pollyann Kennedy, MD   General endotracheal anesthesia was used.  No complications.   FINDINGS:  Papillary type lesion that seemed to be arising from  diffusely from the glottic and subglottic mucosa and mostly anteriorly,  but some involvement of the posterior left vocal cord and vocal process.  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  No  evidence of papilloma.   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  Risks, benefits, alternatives, complications of procedure  explained to the patient who seemed to understand and agreed to surgery.   PROCEDURE:  The patient was taken to the operating room and placed on  the operating table in supine position.  Following induction of general  endotracheal anesthesia, table was turned and the patient was draped in  standard fashion.  The Jako laryngoscope was used to examine the larynx  and was attached to the Mayo stand suspension apparatus.  The above-  mentioned findings were noted.  Multiple biopsies were taken from the  exophytic papillary type lesions.  The remainder of the lesions were  treated using the carbon dioxide laser attached to the microscope at 4 W  continuous power until all evidence of the lesion was ablated.  The  patient was awakened,  extubated, transferred to recovery in stable  condition.      Jefry H. Pollyann Kennedy, MD  Electronically Signed     JHR/MEDQ  D:  12/02/2006  T:  12/02/2006  Job:  [id number]

## 2010-11-11 NOTE — Assessment & Plan Note (Signed)
[facility]                          [facility]   Sabrina, Rhodes                   MRN:          [id number]  DATE:10/22/2008                            DOB:          08/29/1950    REFERRING PHYSICIAN:  Ramon Dredge L. Juanetta Gosling, M.D.   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  The patient has single-  vessel coronary artery disease with stent to the right coronary artery,  which was patent by catheterization in 2007.  She also had a nonischemic  Cardiolite in 2008.  The patient reports currently no chest pain,  shortness of breath, orthopnea or PND.  She does have musculoskeletal  pain in the right chest when she moves about.  There is no exertional  pain; however.   MEDICATIONS:  1. Aspirin 81 mg p.o. daily.  2. Nexium 20 mg p.o. daily.  3. Humulin.  4. Xanax 0.5 mg q.i.d. p.r.n.  5. Levothyroxine.  6. Humulin N 12 units in a.m. and 2 units in the p.m.   PHYSICAL EXAMINATION:  VITAL SIGNS:  Blood pressure 148/89, heart rate  76, respirations 18, and weight 171.  NECK:  Normal carotid upstroke and no carotid bruits.  LUNGS:  Clear breath sounds bilaterally.  HEART:  Regular rate and rhythm.  Normal S1 and S2.  No murmur, rubs, or  gallops.  ABDOMEN:  Soft and nontender.  No rebound or guarding.  Good bowel  sounds.  EXTREMITIES:  No cyanosis, clubbing, or edema.  NEUROLOGIC:  The patient is alert and oriented.  Grossly non focal.   PROBLEM LIST:  1. Atypical chest pain, likely musculoskeletal.  2. Single-vessel coronary artery disease.      a.     Status post Cypher stent of the right coronary artery       October 2006 and patent by catheterization October 2007.      b.     Normal left ventricular function.      c.     Normal exercise Cardiolite study of 67% May 2008.  3. Chronic obstructive pulmonary disease and history of tobacco use,  discontinued.  4. Dyslipidemia.  5. Insulin-dependent diabetes mellitus.  6. 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  7. Anxiety disorder.  8. Hypothyroidism.   PLAN:  1. The patient is doing well from a cardiovascular perspective.  2. She does have atypical chest pain and I tried to reassure her that      this is not angina.  In any event, she is due for a stress test in      6 months from now.  3. The patient can follow up with [place] in 6 months.     Learta Codding, MD,FACC  Electronically Signed    GED/MedQ  DD: 10/22/2008  DT: 10/23/2008  Job #: [id number]   cc:   Ramon Dredge L. Juanetta Gosling, M.D.

## 2010-11-11 NOTE — Assessment & Plan Note (Signed)
[facility]                          [facility] NOTE   PENI, RUPARD                   MRN:          [id number]  DATE:10/21/2007                            DOB:          1951-01-26    PRIMARY CARDIOLOGIST:  Dr. Lewayne Bunting.   REASON FOR VISIT:  Scheduled followup.   Ms. Schnyder continues to do well from a cardiac standpoint, with no  interim development of signs or symptoms suggestive of unstable angina  pectoris.   Since last seen in the clinic, the patient continues to tolerate her  minimal medication regimen.  It turns out that she is actually taking  only 6.25 mg of Toprol on a daily basis, but generally runs a normal  blood pressure (80% of time).  However, she has noted some occasional  elevated readings in the 150 systolic range.  Conversely, however, she  also suggests occasional readings as low as 80-90 systolic.  Nevertheless, she is asymptomatic with either of these fluctuations.   We also spoke briefly regarding lipid management, given that she has not  been on a statin.  Review of her records suggests reported allergy to  both CRESTOR and LIPITOR.  She confirms this by stating that she breaks  out in a rash.  She also reported intolerance to ZETIA, which I placed  her on back in December 2007.   Electrocardiogram today reveals SR 73 was normal axis and nonspecific ST  abnormalities.   CURRENT MEDICATIONS:  Toprol XL 25 mg 1/4 tablet daily.  Aspirin 81  daily, Nexium 20 daily, Humulin R sliding scale, Humulin N 2 units  b.i.d.  Xanax 0.5 mg b.i.d.   PHYSICAL EXAMINATION:  Blood pressure 138/87, pulse 83, regular, weight  95.5.  GENERAL:  A 60 year old female, sitting upright, in no distress.  HEENT:  Normocephalic.  Permanent tracheostomy site.  NECK:  Palpable carotid pulse without bruits; no JVD.  LUNGS:  Diminished breath sounds bases, but without crackles or wheezes.  HEART:  Regular rate and rhythm  (S1, S2) no significant murmurs.  No  rubs or gallops.  ABDOMEN:  Soft, nontender.  EXTREMITIES:  No edema.  NEURO:  No focal deficit.   IMPRESSION:  1. Single-vessel coronary artery disease.      a.     Status post CYPHER stenting of RCA, October 2006:  widely       patent by cardiac catheterization, February 2007.      b.     Normal left ventricular function.  2. Chronic obstructive pulmonary disease, history of tobacco.  3. Dyslipidemia.      a.     Reported history of allergy to CRESTOR, LIPITOR, and ZETIA.  4. Insulin dependent diabetes mellitus.  5. Longstanding history of palpitations.  6. History of laryngeal cancer.      a.     Status post laryngectomy/permanent tracheostomy.  7. Anxiety disorder.   PLAN:  1. Given the patient's reported history of occasional low blood      pressure readings, we took blood pressure readings in both arms.      These were essentially identical,  with readings of 110/68 left and      116/64 right.  Given these findings, and given the fact that she      takes reassurance in knowing that she is on some Toprol regardless      of how small the dose, we elected to not make any adjustments in      this medication regimen at this point in time.  I have, however,      advised her to resume fish oil, which she apparently took several      years ago, given that she is not able to tolerate any statin, as      well as ZETIA.  She has elected to do so and I suggested 1 gram      twice daily.  2. Schedule return clinic followup with myself and Dr. Andee Lineman in 6      months, or sooner as needed.      Gene Serpe, PA-C  Electronically Signed      Learta Codding, MD,FACC  Electronically Signed   GS/MedQ  DD: 10/21/2007  DT: 10/21/2007  Job #: [id number]

## 2010-11-11 NOTE — Assessment & Plan Note (Signed)
[facility]                          [facility] NOTE   SABA, GOMM                   MRN:          [id number]  DATE:07/04/2007                            DOB:          02-22-1951    HISTORY OF PRESENT ILLNESS:  The patient is a 56-year female with severe  single vessel coronary artery disease status post stent in 2006.  Catheterization in 2007 showed patent stents.  The patient underwent a  recent laryngectomy at [facility] secondary to  laryngeal cancer.  The patient has now permanent tracheostomy.  The  patient gives a history while writing down her symptoms.  She denies any  chest pain or shortness of breath, orthopnea, PND.  She really has no  cardiovascular complaints.   MEDICATIONS:  1. Toprol XL 25 mg p.o. daily.  2. Aspirin 81 mg a day.  3. Nexium.  4. Humulin.  5. Xanax 0.5 mg p.o. b.i.d.   PHYSICAL EXAMINATION:  VITAL SIGNS:  Blood pressure is 121/67, heart  rate 75 beats per minutes, weight is 88 pounds.  NECK: Normal carotid upstroke and no carotid bruits.  LUNGS:  Clear breath sounds bilaterally.  HEART:  Regular rate and rhythm.  Normal S1-S2.  ABDOMEN:  Soft.  EXTREMITIES: No cyanosis, clubbing or edema.   PROBLEM LIST:  1. Single vessel coronary artery disease.      a.     Status post stent of the right coronary 2006.      b.     Repeat catheterization 2007 with widely patent stent.  2. Normal left ventricular function.  3. Chronic obstructive pulmonary disease,  tobacco use discontinued.  4. Laryngeal cancer status post laryngectomy and permanent      tracheostomy.  5. Palpitations, stable.   PLAN:  1. The patient has no significant cardiovascular symptoms. Continue      her current medical regimen.  2. Patient is now recovering from laryngeal surgery and laryngectomy.      We will continue to follow her closely the next couple of months.    Learta Codding, MD,FACC  Electronically Signed   GED/MedQ  DD: 07/04/2007  DT: 07/04/2007  Job #: [id number]

## 2010-11-11 NOTE — Assessment & Plan Note (Signed)
[facility]                          [facility] NOTE   [name], [name]                   MRN:          [id number]  DATE:01/21/2007                            DOB:          Dec 20, 1950    REFERRING PHYSICIAN:  Ramon Dredge L. Juanetta Gosling, M.D.   HISTORY OF PRESENT ILLNESS:  The patient is a 60 year old female with  severe single vessel coronary artery disease status post stent to the  RCA in 2006. The patient reports no substernal chest pain. From a  cardiovascular standpoint, she is doing quite well. The patient has had  recent throat surgery and she was found to have a small laryngeal  cancer. She appears to be doing well from that perspective. The patient  reports that she runs low blood pressures at home, particularly when she  takes Toprol in the morning.   MEDICATIONS:  1. Toprol XL 25 mg p.o. daily.  2. Aspirin 81 mg daily.  3. Nexium.  4. Humulin N and Humulin R.   PHYSICAL EXAMINATION:  VITAL SIGNS:  Blood pressure 81/58 on repeat by  myself it was 90/60. The patient reports no orthostatic symptoms. Heart  rate 93, weight 83 pounds.  NECK:  Normal carotid upstroke, no carotid bruits.  LUNGS:  Clear breath sounds bilaterally.  HEART:  Regular rate and rhythm, normal S1, S2, no murmurs, rubs, or  gallops.  ABDOMEN:  Soft.  EXTREMITIES:  No cyanosis, clubbing, or edema.   PROBLEM LIST:  1. Single vessel coronary artery disease.      a.     Status post stent to right coronary artery 2006.      b.     Repeat catheterization 2007; widely patent stent.  2. Normal left ventricular function.  3. Chronic obstructive pulmonary disease, tobacco use discontinued.  4. Insulin dependent diabetes mellitus.  5. Dyslipidemia.  6. Anxiety disorder.  7. Laryngeal CA, status post surgery.  8. Long standing severe palpitations, resolved.   PLAN:  1. The patient is doing well from a cardiovascular perspective.  2. Blood pressures are  running on the low side and I have asked her to      take her Toprol in the evening. I also asked her to increase her      fluid intake as I do expect that the patient may      have a degree of diabetic neuropathy causing dysautonomia.  3. The patient can follow up with [place] in six months.     Learta Codding, MD,FACC  Electronically Signed    GED/MedQ  DD: 01/21/2007  DT: 01/22/2007  Job #: [id number]   cc:   Ramon Dredge L. Juanetta Gosling, M.D.

## 2010-11-12 NOTE — Telephone Encounter (Signed)
No answer

## 2010-11-14 NOTE — Discharge Summary (Signed)
[name], [name]            ACCOUNT NO.:  [id number]   MEDICAL RECORD NO.:  [id number]          PATIENT TYPE:  INP   LOCATION:  3703                         FACILITY:  [facility]   PHYSICIAN:  Learta Codding, M.D. LHCDATE OF BIRTH:  12-07-1950   DATE OF ADMISSION:  08/12/2005  DATE OF DISCHARGE:  08/14/2005                                 DISCHARGE SUMMARY   PRIMARY DIAGNOSIS:  Chest pain, no critical coronary artery disease by  catheterization this admission.   SECONDARY DIAGNOSES:  1.  Diabetes, poorly controlled, with blood sugars greater than 400 multiple      times this admission and patient reluctant to take sliding scale insulin      in therapeutic doses.  2.  Hyperlipidemia.  3.  Ongoing tobacco use.  4.  Hypertension.  5.  Gastroesophageal reflux disease.  6.  History of catheterization in October of 2006 with a Cypher stent      reducing a 95% right coronary artery to 0%.   ALLERGIES:  ALLERGY OR INTOLERANCE TO CODEINE, PENICILLIN, AND LIPITOR.   PROCEDURES:  1.  Cardiac catheterization.  2.  Coronary arteriogram.  3.  Left ventriculogram.   TIME SPENT AT DISCHARGE:  32 minutes.   HOSPITAL COURSE:  Ms. Westcott is a 60 year old female with known coronary  artery disease.  She had PTCA and a Cypher stent in October, and her risk  factors are poorly controlled.  She presented with two days of chest pain  and was admitted for further evaluation and treatment.  She initially presented to [facility] emergency room but was transferred to  [facility].   Her cardiac enzymes were negative for MI, and her chest pain was atypical.  A cardiac catheterization was performed to further define her anatomy.  The  cardiac catheterization showed 25% stenosis in the LAD and OM, with no in-  stent restenosis and an EF of 60%.  Dr. Dorethea Clan evaluated Ms. Catanese and  recommended medical management.   Ms. Cage had elevated blood sugars and would consistently refuse to take  the  full dose of sliding scale insulin that was indicated.  Dr. Dorethea Clan felt  that she had very little insight into her disease process and very limited  interest in cooperating with the various healthcare providers.  She would  agree to take a limited amount of sliding scale insulin.   Post-catheterization BUN was 31, with a creatinine of 1.6.  Her blood  pressure was also a little low, and she is being hydrated.  Her blood sugars  were once again greater than 400.  She was evaluated by Dr. Andee Lineman and is to  get her home insulin dose as well as hydration.  She is also to get a  followup BMET on Monday.  Once she is hydrated and her blood pressure is  stabilized, she is tentatively considered stable for discharge, with  outpatient followup arranged.   ACTIVITY:  Her activity level is to be limited for two days, with no lifting  for a week.   DISCHARGE INSTRUCTIONS:  She is to call our office with any problems  with  the catheterization site.   DIET:  She is to stick to a low fat diabetic diet.   DISCHARGE MEDICATIONS:  1.  Humulin N 12 units b.i.d. with regular insulin 1 unit a.m. and sliding      scale p.m. per the patient.  2.  Nexium 40 mg daily.  3.  Lopressor 25 b.i.d.  4.  Phenergan and nitroglycerin p.r.n.  5.  Plavix 75 mg daily.  6.  Aspirin 81 mg daily.  7.  Medical therapy for hyperlipidemia is to be addressed as an outpatient.   LABORATORY DATA:  Total cholesterol 197, triglycerides 127, HDL 46, LDL 196  this admission.   FOLLOW UP:  She is to follow up with Dr. Andee Lineman on August 19, 2005 at 1:50  p.m., and she is to follow up with Dr. Juanetta Gosling as well.      Theodore Demark, P.A. [facility]      Learta Codding, M.D. [facility]  Electronically Signed    RB/MEDQ  D:  08/14/2005  T:  08/14/2005  Job:  [id number]   cc:   Ramon Dredge L. Juanetta Gosling, M.D.  Fax: [phone]   [facility]  [place], [place]

## 2010-11-14 NOTE — Cardiovascular Report (Signed)
NAMEJASSLYN, Rhodes            ACCOUNT NO.:  [id number]   MEDICAL RECORD NO.:  [id number]          PATIENT TYPE:  INP   LOCATION:  3703                         FACILITY:  [facility]   PHYSICIAN:  Vida Roller, M.D.   DATE OF BIRTH:  Sep 17, 1950   DATE OF PROCEDURE:  08/13/2005  DATE OF DISCHARGE:                              CARDIAC CATHETERIZATION   [name] underwent left heart catheterization.  She is a woman who has  known coronary artery disease, diabetes, hypertension, hyperlipidemia, and  medical noncompliance with ongoing tobacco abuse.  She has a Cypher stent in  her right coronary artery that was placed in October 2006.  She presented  with atypical chest discomfort, ruled out for myocardial infarction, and  this is an evaluation for ischemia.   PROCEDURES PERFORMED:  1.  Left heart catheterization.  2.  Selective coronary angiography.  3.  Left ventriculography.   DESCRIPTION OF PROCEDURE:  After obtaining informed consent, the patient was  brought to the cardiac catheterization laboratory in a fasting state.  There  she was prepped and draped in the usual sterile manner and local anesthetic  was obtained over the right groin using 1% lidocaine without epinephrine.  The right femoral artery was cannulated using the modified Seldinger  technique with a 5 French 10 cm sheath and left heart catheterization was  performed using a Judkins left #4, Judkins right #4, and a pigtail catheter.  The pigtail catheter was used for left ventriculography in the 30 degree RAO  view.  At the conclusion of the procedures, the catheters were removed.  The  patient was moved back to the cardiology holding area where the femoral  artery sheath was removed.  Hemostasis was obtained using direct manual  pressure.  At the conclusion of the hold, there was no evidence of  ecchymosis or hematoma formation and distal pulses were intact.  Total  fluoroscopic time was 3.7 minutes.  Total  iodonized contrast used was 100  mL.   RESULTS:  Aortic pressure 106/45 with a mean 71 mmHg.  Left ventricular  pressure 106/0 with an end diastolic pressure of 9 mmHg.   SELECTIVE CORONARY ANGIOGRAPHY:  The left main coronary artery is a moderate caliber artery which is  angiographically unremarkable.   The left anterior descending coronary artery is a moderate to small caliber  vessel which has a 25% stenosis in its proximal portion and a 25% stenosis  in its mid portion.   The left circumflex coronary artery has a 25% stenosis at the ostium of a  large obtuse marginal which makes up the majority of the circumflex  distribution.   The right coronary artery is a large dominant vessel and has a stent in its  proximal to mid portion which is widely patent.  The posterior descending  artery is a moderate caliber vessel which is angiographically unremarkable.   The left ventriculogram reveals preserved LV systolic function estimated  ejection fraction 60%, no wall motion abnormalities, no mitral  regurgitation.   ASSESSMENT:  1.  Single vessel coronary artery disease.  2.  Widely patent right coronary stent.  3.  Normal LV systolic function.   PLAN:  Medical management, tobacco cessation, more attention to risk factor  modification.  She will need to stay overnight because she is on an insulin  infusion and she will go home in the morning.      Vida Roller, M.D.  Electronically Signed     JH/MEDQ  D:  08/13/2005  T:  08/13/2005  Job:  [id number]

## 2010-11-14 NOTE — Cardiovascular Report (Signed)
NAMEVERLENE, GLANTZ            ACCOUNT NO.:  [id number]   MEDICAL RECORD NO.:  [id number]          PATIENT TYPE:  OIB   LOCATION:  6524                         FACILITY:  [facility]   PHYSICIAN:  Charlies Constable, M.D. [facility] DATE OF BIRTH:  Jan 25, 1951   DATE OF PROCEDURE:  04/10/2005  DATE OF DISCHARGE:                              CARDIAC CATHETERIZATION   PROCEDURES:  Coronary intervention.   CLINICAL HISTORY:  Ms. Erck is 60 years old and has insulin-dependent  diabetes and is a smoker.  She was seen in the emergency room recently with  chest pain and was seen in consultation by Dr. Andee Lineman who arranged for her  to come in for evaluation with angiography.  This was performed shortly  before this in the outpatient laboratory.  We found a very critical lesion  in the mid right coronary artery and felt the patient should not go home  before having this treated, so we brought her up to the main lab for  intervention.   DESCRIPTION OF PROCEDURE:  The procedure was performed via the right femoral  artery.  We exchanged for a new 6-French sheath.  The patient had been given  600 mg Plavix and was given bivalirudin bolus and infusion.  We used a 6-  [place] JR-4 guiding catheter with side holes.  We crossed the lesion in the  mid right coronary artery with a Prowater wire without difficulty.  We  predilated with 2.5 x 15 mm Maverick, performing 3 inflations up to 8  atmospheres for 30 seconds.  We then deployed 2.5 x 18 mm Cypher stent,  deploying this with 1 inflation at 14 atmospheres x 30 seconds.  We post  dilated with a 2.75 x 15 mm Quantum Maverick, performing 2 inflations up to  15 atmospheres for 30 seconds.  Final diagnostic studies were then performed  through the guiding catheter.  The patient tolerated the procedure well and  left the laboratory in satisfactory condition.   RESULTS:  Stenosis in the right coronary artery was estimated at 95%.  Following stenting, this improved to  less than 10%.   DISPOSITION:  The patient returned to room for further observation.  We will  stress the importance of remaining on Plavix long term.  I chose a drug-  eluting stent because the patient has insulin-dependent diabetes and the  vessel was only 2.5 in diameter.  The patient indicated that she would  commit to taking Plavix long term.           ______________________________  Charlies Constable, M.D. [facility]     BB/MEDQ  D:  04/10/2005  T:  04/10/2005  Job:  [id number]   cc:   Learta Codding, M.D. [facility]  [address]  [address]  [place]  [place] [address]   Oneal Deputy. Juanetta Gosling, M.D.  Fax: [phone]   Cardiopulmonary Lab

## 2010-11-14 NOTE — Assessment & Plan Note (Signed)
[facility]                                 ON-CALL NOTE   [name], [name]                     MRN:          [id number]  DATE:07/21/2006                            DOB:          May 20, 1951    She is a patient of Dr. Margarita Mail.  I received a page from the service  tonight regarding [name], who apparently had a medication  question.  I called the number provided, [phone] on five separate  occasions over the period of about four hours, and initially I received  a busy signal.  However, on subsequent calls there was no answer and no  option for leaving a message.  Per the service, the patient did not call  back or leave other contact information.     Nicolasa Ducking, ANP  Electronically Signed    CB/MedQ  DD: 07/21/2006  DT: 07/22/2006  Job #: [id number]

## 2010-11-14 NOTE — H&P (Signed)
[name], [name]            ACCOUNT NO.:  [id number]   MEDICAL RECORD NO.:  [id number]          PATIENT TYPE:  EMS   LOCATION:  ED                            FACILITY:  [facility]   PHYSICIAN:  Vida Roller, M.D.   DATE OF BIRTH:  02-01-1951   DATE OF ADMISSION:  08/12/2005  DATE OF DISCHARGE:                                HISTORY & PHYSICAL   PRIMARY CARE PHYSICIAN:  Edward L. Juanetta Gosling, M.D., [place] [place].   CARDIOLOGISTS:  Jonelle Sidle, M.D.  and Willa Rough, M.D., [facility]  Cardiology.   HISTORY OF PRESENT ILLNESS:  [name] who has a  history of coronary artery disease status post percutaneous  revascularization of the right coronary artery in October 2006 with a Cypher  stent.  She has multiple cardiac risk factors which are uncontrolled  including ongoing tobacco abuse.  She presents with 2 days of discomfort in  her chest which occur at rest associated with some tingling and numbness in  her arms.  She is a very difficult historian and has multiple somatic  complaints but does report discomfort in her chest that is slightly  different from her previous angina.  She is currently pain free and has been  pain free for several hours with no shortness of breath.  She is resting  comfortably here in the ER at [facility].   PAST MEDICAL HISTORY:  Significant for:  1.  Coronary artery disease.  She is status post catheterization in October      2006 where she had a 50% LAD, a 50% obtuse marginal #1, and a 95% mid      right coronary artery.  The right coronary artery was stented with a 2.5      x 18 mm Cypher stent. At that time her ejection fraction was about 50%.  2.  Hyperlipidemia which is not currently being treated due to some      intolerance to LIPITOR.  3.  Diabetes mellitus, insulin requiring.  4.  Ongoing tobacco abuse.  5.  History of hypertension which has been poorly controlled.   She has no past  surgical history.   Her smoking history is about a 45 to 50-pack-year smoking history.  She  still actively smokes 1/2 pack a day.  She does not drink alcohol or use  illicit drugs.   CURRENT MEDICATIONS:  1.  Nexium 20 mg once a day.  2.  Aspirin 81 mg once a day.  3.  Nitroglycerin as needed, and she did not use any with her discomfort.  4.  Plavix 75 mg a day.  5.  Phenergan.   ALLERGIES:  She is allergic to CODEINE, PENICILLIN, and there is question of  intolerance to LIPITOR.   REVIEW OF SYSTEMS:  Generally positive without any clear localization of  symptoms.  Specifically, she denies palpitations, PND, orthopnea, syncope.  She does have shortness of breath, dyspnea on exertion but no bleeding,  hematochezia, hematemesis, melena.   PHYSICAL EXAMINATION:  GENERAL:  She is a thin white female in no apparent  distress.  She is alert and oriented x4.  VITAL SIGNS:  She is afebrile at 97.5, pulse 68, respirations 18, blood  pressure 153/68.  She is saturating at 98% on room air.  HEAD, EYES, EARS, NOSE, AND THROAT:  Examination is unremarkable.  NECK:  Supple.  There is no jugular venous distention or carotid bruits.  Her thyroid appears to be normal size and midline.  CHEST:  Coarse breaths sounds at the bilateral bases with no obvious rales.  There are no obvious wheezes.  There is slightly increased expiratory phase.  CARDIOVASCULAR: Regular with a soft 1/6 systolic murmur, what appears to be  an S4.  ABDOMEN: Soft, nontender.  EXTREMITIES:  Lower extremities without significant clubbing, cyanosis, or  edema.  Pulses appear to be 1+ throughout.  There are no bruits noted.  NEUROLOGIC: Unremarkable.  MUSCULOSKELETAL: Unremarkable.  BREASTS/GU/RECTAL: Exams are deferred.   LABORATORY DATA:  She has 2 sets of point-of-care cardiac markers which are  normal.  She has a CBC which shows a white blood cell count of 5.5,  hemoglobin 12, hematocrit 36, platelet count 273.  INR 1,  PTT 36.  Sodium  138, potassium 3.7, chloride 101, bicarbonate 27, glucose 299, BUN 15,  creatinine 0.9.  Liver functions studies are normal.   Electrocardiogram shows sinus rhythm at a rate of 65.  She has voltage  criteria for LVH. She has ST segment depression in the inferior leads with  no obvious Q waves seen.  Comparing this with an electrocardiogram from  2001, it appears the ST segment depression is slightly more marked on this  EKG.   Chest x-ray is pending.   IMPRESSION:  1.  This is a woman who has significant history of coronary disease who      presents with atypical chest discomfort with an abnormal      electrocardiogram and point-of-care markers which are reassuring.  2.  Coronary artery disease. She is status post catheterization. She has      residual coronary disease, although she had a revascularization      procedure in her right coronary artery.  She had a drug-eluting stent,      and it appears there was a period when she was off her Plavix that is      not clearly documented in her health records.  Her ejection fraction is      slightly low.  3.  Hyperlipidemia.  She is not on any medications for this, and this will      need to be addressed.  4.  Diabetes.  She is insulin requiring, and her blood glucose is poorly      controlled.  5.  Ongoing tobacco abuse.  6.  History of anxiety.   PLAN:  She needs to be admitted to [facility], and we will transport her via  [facility].  She will need heparin infusion, aspirin, and nitroglycerin, all  IV.  The nitroglycerin can be held until she develops more chest discomfort.  1.  She is to continue her beta blocker.  2.  There is a question as to whether or not she should have a statin      added.  3.  She will need an evaluation for smoking cessation.  4.  She will need a heart catheterization in the morning.      Vida Roller, M.D.  Electronically Signed    JH/MEDQ  D:  08/12/2005  T:  08/12/2005  Job:   [id number]  cc:   Ramon Dredge L. Juanetta Gosling, M.D.  Fax: [phone]

## 2010-11-14 NOTE — Discharge Summary (Signed)
[name], [name] NO.:  [id number]   MEDICAL RECORD NO.:  [id number]          PATIENT TYPE:  OIB   LOCATION:  6524                         FACILITY:  [facility]   PHYSICIAN:  Jonelle Sidle, M.D. LHCDATE OF BIRTH:  Jun 08, 1951   DATE OF ADMISSION:  04/10/2005  DATE OF DISCHARGE:  04/11/2005                                 DISCHARGE SUMMARY   CARDIOLOGIST:  Charlies Constable, M.D.   DISCHARGING CARDIOLOGIST:  Jonelle Sidle, M.D.   PROCEDURES PERFORMED DURING HOSPITALIZATION:  Left heart catheterization  with an SCA with a stent placement to the right coronary artery performed on  April 10, 2005.   ALLERGIES:  The patient has allergies to CODEINE and PENICILLIN.   HISTORY OF PRESENT ILLNESS:  The patient is a 60 year old female admitted on  April 10, 2005 for a planned left heart catheterization with stent  placement.  The patient was seen by Learta Codding, M.D. and decided to have  heart catheterization for positive risk factors for insulin-dependent  diabetes and continued smoking.  Heart catheterization performed on April 10, 2005 showed coronary artery disease with 50% narrowing of the proximal  left anterior descending, 40% narrowing in the ostium of the first diagonal  branch, and 40 and 50% in the marginal branch of the circumflex, and 95%  stenosis in the mid-right coronary artery, 50% stenosis in the distal right  coronary, and normal left ventricular function.  A stent was placed in the  right coronary artery on April 10, 2005 which was a 2.5 x 18 Cypher stent.  On April 11, 2005, in the a.m., the patient feels fine, does complain of  heart pounding, but review of telemetry showed normal sinus rhythm with  occasional PVCs.  The patient also received tobacco cessation.  The patient  was seen by Dr. Diona Browner and deemed stable to be discharged to home.   DISCHARGE LABORATORY STUDIES:  White blood count 5.2, hemoglobin 12.8,  hematocrit  38.1, platelet count 194,000. Sodium 135, potassium 3.9, chloride  101, CO2 28, glucose 256, creatinine 1.1, BUN 10. CK-MB and troponin were  negative.   FOLLOW UP:  The patient is to follow up with Learta Codding, M.D. in two  weeks, and the office will call.   DIET:  The patient was instructed to eat a Heart Healthy diet.   DISCHARGE INSTRUCTIONS:  The patient was instructed to please stop smoking  and to not swim or bathe for one week.  The patient was also instructed not  to lift more than 10 pounds for a week and not to drive for two days.   DISCHARGE MEDICATIONS:  1.  Humulin NPH insulin 13 units in the a.m. and 3 units in the p.m.  2.  Regular Insulin sliding scale.  3.  Xanax 0.5 mg daily.  4.  Clopidogrel 75 mg daily.  5.  Aspirin 81 mg daily.   TOTAL TIME FOR DICTATION:  Less than 30 minutes.     ______________________________  April Humphrey, NP    ______________________________  Jonelle Sidle, M.D. [facility]    AH/MEDQ  D:  04/11/2005  T:  04/11/2005  Job:  [id number]   cc:   Charlies Constable, M.D. [facility]  [address]  [address]  [place]  [place] [address]   Learta Codding, M.D. [facility]  [address]  [address]  [place]  [place] [address]

## 2010-11-14 NOTE — Telephone Encounter (Signed)
No answer again today.  Patient initially placed call to [place].  Made numerous attempts to contact patient, but unsuccessful.
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
## 2010-11-14 NOTE — Assessment & Plan Note (Signed)
[facility]                          [facility] OFFICE NOTE   ROBYNNE, ROAT                   MRN:          [id number]  DATE:08/17/2006                            DOB:          [birth year]    HISTORY OF PRESENT ILLNESS:  Patient is a 60 year old female with a  history of severe single-vessel coronary artery disease status post  stent to the RCA in 2006.  The patient has occasional atypical ches pain  .  She also stopped taking her Plavix, which she feels has been causing  her difficulty swallowing and hoarseness.  She has had, however, no  significant bleeding.  She now remains on aspirin.  She also has seen  ENT for her chronic hoarseness.  The patient continues to have panic  attacks, but appears to be stable from a cardiovascular perspective.   MEDICATIONS:  1. Toprol XL 25 mg p.o. daily.  2. Humulin NNR.  3. Nexium 20 mg p.o. daily.  4. Aspirin 2 tablets, 81 mg daily.   The patient has also stopped taking statin drug therapy.   PHYSICAL EXAMINATION:  VITAL SIGNS:  Blood pressure 120/76.  Heart rate  80 beats per minute.  NECK EXAM:  Normal carotid upstrokes.  No carotid bruits.  LUNGS:  Clear breath sounds bilaterally.  HEART:  Regular rate and rhythm.  Normal S1 and S2.  ABDOMEN:  Soft.  EXTREMITY EXAM:  No cyanosis, clubbing or edema.   PROBLEM LIST:  1. Single-vessel coronary artery disease.      a.     Status post  stent to the right coronary artery October       2006.  Repeat catheterization February 2007 with widely patent       stent.  2. Left ventricular function.  3. Chronic obstructive pulmonary disease and ongoing tobacco use.  4. Insulin dependent diabetes mellitus.  5. Dyslipidemia.  Reports allergy to statins.  6. Anxiety disorder.  7. Longstanding history of palpitations with negative Holter monitor.   PLAN:  1. Cardiovascular the patient  is stable.  2. I again encouraged her to stop smoking.  3. The  patient will have an exercise Cardiolite study done to make      sure she has no ischemia.     Learta Codding, MD,FACC  Electronically Signed    GED/MedQ  DD: 08/17/2006  DT: 08/17/2006  Job #: [id number]

## 2010-11-14 NOTE — Op Note (Signed)
NAMEEMERLY, PRAK            ACCOUNT NO.:  [id number]   MEDICAL RECORD NO.:  [id number]          PATIENT TYPE:  AMB   LOCATION:  DAY                           FACILITY:  [facility]   PHYSICIAN:  Lionel December, M.D.    DATE OF BIRTH:  03-05-1951   DATE OF PROCEDURE:  03/04/2005  DATE OF DISCHARGE:                                 OPERATIVE REPORT   PROCEDURE:  Esophagogastroduodenoscopy.   INDICATIONS:  [name] is a 60 year old Caucasian female with few week history  of nausea with sporadic vomiting and a 10-pound weight loss. She has insulin-  dependent diabetes mellitus and feels it is controlled satisfactorily. She  has chronic GERD. She is on low-dose Nexium which seemed to control her  heartburn but not her other symptoms. She is undergoing diagnostic EGD.  Procedure risks were reviewed with the patient, and informed consent was  obtained.   PREMEDICATION:  Cetacaine spray pharyngeal topical anesthesia, Demerol 50 mg  IV, Versed 10 mg IV in divided dose.   FINDINGS:  Procedure performed in endoscopy suite. The patient's vital signs  and O2 saturation were monitored during the procedure and remained stable.  The patient was placed in left lateral position, and Olympus videoscope was  passed via oropharynx without any difficulty into esophagus.   Esophagus. Mucosa of the esophagus was normal throughout. GE junction was  unremarkable, located at 38 cm from the incisors. No hernia was noted.   Stomach. There was some bile in it, but there was no food debris. Stomach  distended very well with insufflation. A few petechiae at gastric body and  patchy erythema at antrum, but no erosions or ulcers were noted. Pyloric  channel was patent. Angularis, fundus and cardia were examined by  retroflexing the scope and were normal.   Duodenum. Bulbar mucosa was normal. Scope was passed to the second and third  part of duodenum where mucosa and folds were normal. Pictures taken for the  record.  Endoscope was withdrawn. The patient tolerated the procedure well.   FINAL DIAGNOSIS:  Nonerosive antral gastritis, otherwise normal exam.  Gastritis could be related to duodenal gastric bile reflux.   RECOMMENDATIONS:  1.  H pylori serology will be checked.  2.  Carafate liquid 1 gram a.c. and q.h.s.; prescription given for 2 weeks      with 1 refill. If she does not improve with this therapy, would consider      solid phase gastric emptying study.      Lionel December, M.D.  Electronically Signed     NR/MEDQ  D:  03/04/2005  T:  03/04/2005  Job:  [id number]   cc:   Juanetta Gosling, M.D.

## 2010-11-14 NOTE — Op Note (Signed)
NAMETERRY, ABILA                      ACCOUNT NO.:  [id number]   MEDICAL RECORD NO.:  [id number]                   PATIENT TYPE:  AMB   LOCATION:  DAY                                  FACILITY:  [facility]   PHYSICIAN:  Lionel December, M.D.                 DATE OF BIRTH:  1951/03/20   DATE OF PROCEDURE:  03/29/2003  DATE OF DISCHARGE:                                 OPERATIVE REPORT   PROCEDURE:  Total colonoscopy.   INDICATIONS FOR PROCEDURE:  [name] is a 60 year old Caucasian female with  recent change in her bowel habits.  She has developed constipation.  It is  suspected that she may have constipation-predominant IBS.  Since there has  been sudden change, colonoscopy was recommended, both for diagnostic and  screening purposes.  The procedure and risks were reviewed with the patient,  and informed consent was obtained.   PREOPERATIVE MEDICATIONS:  Demerol 50 mg IV, Versed 12 mg IV in divided  doses.   FINDINGS:  The procedure was performed in the endoscopy suite.  The  patient's vital signs and O2 saturations were monitored during the procedure  and remained stable.  The patient was placed in the left lateral recumbent  position and rectal examination performed.  No abnormality noted on external  or digital exam.  The Olympus videoscope was placed into the rectum and  advanced into the region of the sigmoid colon where a few diverticula were  noted.  The preparation was satisfactory to fair.  She had some stool at the  hepatic flexure.  The scope was advanced to the cecum which was identified  by the appendiceal stump and ileocecal valve.  Pictures were taken for the  record.  As the scope was withdrawn, the colonic mucosa was once again  carefully examined.  There were two tiny polyps on a fold at the  rectosigmoid junction which were ablated by cold biopsy and submitted in one  container.  The rectal mucosa was normal.  The scope was retroflexed to  examine the  anorectal junction, and small hemorrhoids were noted below the  dentate line.  The endoscope was straightened and withdrawn.  The patient  tolerated the procedure well.   FINAL DIAGNOSES:  1. A few scattered diverticula at the sigmoid colon.  2. Two tiny polyps at the rectosigmoid junction which were ablated by cold     biopsy.  3. Small external hemorrhoids.    RECOMMENDATIONS:  She should continue a high fiber diet and Fiber Choice two  tablets daily and use lactulose on a p.r.n. basis.  I will contact the  patient with the biopsy results.  If these are polyps are adenomatous, she  will return for a colonoscopy in five years; otherwise, she could wait 10.       ___________________________________________  Lionel December, M.D.   NR/MEDQ  D:  03/29/2003  T:  03/29/2003  Job:  [id number]   cc:   Ramon Dredge L. Juanetta Gosling, M.D.  [address]  [place]  [place] [address]  Fax: [phone]

## 2010-11-14 NOTE — Op Note (Signed)
[name], [name]            ACCOUNT NO.:  [id number]   MEDICAL RECORD NO.:  [id number]          PATIENT TYPE:  AMB   LOCATION:  DAY                           FACILITY:  [facility]   PHYSICIAN:  Lionel December, M.D.    DATE OF BIRTH:  09-20-1950   DATE OF PROCEDURE:  10/11/2006  DATE OF DISCHARGE:                               OPERATIVE REPORT   PROCEDURE:  Esophagogastroduodenoscopy.   INDICATIONS:  [name] is a 60 year old Caucasian female with multiple  medical problems including diabetes mellitus and CAD, who has been  losing weight and has refractory GERD symptoms.  She is undergoing  diagnostic EGD.  The procedure risks were reviewed the patient, informed  consent was obtained.   MEDICATIONS FOR CONSCIOUS SEDATION:  Benzocaine spray for pharyngeal  topical anesthesia, Demerol 50 mg IV, Versed 10 mg IV, promethazine 25  mg IV in divided dose in diluted form.   FINDINGS:  Procedure performed in endoscopy suite.  The patient's vital  signs and O2 saturation were monitored during procedure and remained  stable.  The patient was extremely anxious and had to be given sedation  even before we could spray her oropharynx.  The patient was placed in  the left lateral recumbent position and Pentax video scope was passed  via oropharynx without any difficulty into esophagus.   Esophagus:  Mucosa of the esophagus normal.  The GE junction was normal  at 37 cm from the incisors.   Stomach:  It was empty and distended very well with insufflation.  There  were there were few antral erosions and a 4 x 8 mm ulcer in the  prepyloric region anterior to the pylorus.  There was a scar at the  angularis.  The pyloric channel was patent, though.  The scope was  retroflexed to examine.  The fundus and cardia were normal.   Duodenum:  Bulbar mucosa was normal.  The scope was passed to the second  part of duodenum, where mucosa and folds were normal.  Endoscope was  withdrawn.  The patient tolerated  the procedure well.   FINAL DIAGNOSIS:  Prepyloric ulcer with erosive antral gastritis.  No  evidence of pyloric stenosis or evidence of erosive esophagitis.   RECOMMENDATIONS:  1. antireflux measures reinforced.  2. Will increase her Nexium to 40 mg b.i.d. , a new prescription given      for a month with 3 refills.  3. H. pylori serology be checked today.  4. We will contact the patient with the results of biopsy and plan to      see her in the office in 4 weeks.  She will undergo follow-up      endoscopy in 10 weeks or so to document healing of this ulcer.  5. The patient can continue her low-dose ASA but should not take any      other OTC NSAIDs.      Lionel December, M.D.  Electronically Signed     NR/MEDQ  D:  10/11/2006  T:  10/11/2006  Job:  [id number]   cc:   [name], M.D.  Fax: [phone]  [name], M.D.  [place], [place], office

## 2010-11-14 NOTE — Cardiovascular Report (Signed)
NAMEMISHKA, STEGEMANN            ACCOUNT NO.:  [id number]   MEDICAL RECORD NO.:  [id number]          PATIENT TYPE:  OIB   LOCATION:  6599                         FACILITY:  [facility]   PHYSICIAN:  Charlies Constable, M.D. [facility] DATE OF BIRTH:  11-14-1950   DATE OF PROCEDURE:  04/10/2005  DATE OF DISCHARGE:                              CARDIAC CATHETERIZATION   HISTORY:  Ms. Beevers if 60 years old and had previously diagnosed  nonobstructive disease at catheterization in 1999.  He had a negative  Cardiolite scan in March of this year.  He recently was seen in the  emergency room with chest pain and sent home and subsequently saw Dr. Lewayne Bunting in consultation.  She has positive risk factors of insulin-dependent  diabetes and continued smoking, and Dr.  Andee Lineman was concerned her recent  symptoms may represent ischemia and arranged for her to have catheterization  today.  She also has a history of anxiety and panic attacks and  hyperlipidemia.   DESCRIPTION OF PROCEDURE:  The procedure was performed in the outpatient  laboratory.  The procedure was performed via the right femoral artery using  4-French preformed coronary catheters.  A frontal arterial puncture was  performed.  The patient tolerated the procedure well.   RESULTS:  Aortic pressure 142/68 with mean of 100, and left ventricular  pressure 142/10.   The LEFT MAIN CORONARY ARTERY was free of significant disease.   The LEFT ANTERIOR DESCENDING ARTERY gave rise to 2 diagonal branches and 3  septal perforators.  There wsz 50% narrowing in the mid LAD and 40%  narrowing at the ostium of the first diagonal branch.   The CIRCUMFLEX artery gave rise to a small A-V branch and a marginal branch.  There was 40 to 50% stenosis in the first portion of the marginal branch.   The RIGHT CORONARY ARTERY is a moderately large vessel and gave rise to a  conus branch, right ventricular branch, posterior descending branch, and 3  posterolateral  branches.  There was 95% stenosis in the mid right coronary  artery.  There was 50% narrowing in the distal right coronary artery.   The LEFT VENTRICULOGRAM was performed in the RAO projection and showed good  wall motion with no areas of hypokinesis.  The estimated ejection fraction  was 50%.   CONCLUSION:  Coronary artery disease with 50% narrowing of proximal left  anterior descending, 40% narrowing in the ostium of the first diagonal  branch, 40 and 50% narrowing in the marginal branch of the circumflex  artery, 95% stenosis in the mid right coronary artery, 50% stenosis in the  distal right coronary artery, and normal left ventricular function.   RECOMMENDATIONS:  The patient has critical stenosis in the mid right  coronary artery.  We will plan to admit the patient from the outpatient  laboratory today and perform percutaneous coronary intervention today.           ______________________________  Charlies Constable, M.D. [facility]     BB/MEDQ  D:  04/10/2005  T:  04/10/2005  Job:  [id number]   cc:   Ramon Dredge  Patrice Paradise, M.D.  Fax: [phone]   Learta Codding, M.D. [facility]  [address]  [address]  [place]  [place] [address]   Cardiopulmonary Lab

## 2010-11-14 NOTE — Assessment & Plan Note (Signed)
[facility]                          [facility] OFFICE NOTE   ALBANY, WINSLOW                   MRN:          [id number]  DATE:06/18/2006                            DOB:          1951/04/08    REASON FOR OFFICE VISIT:  Scheduled office followup.  [name] is a 60 year old female with history of severe single  vessel coronary artery disease, treated with Cypher stenting of the RCA  in October 2006, with a relook catheterization in February of this year  revealing widely patent stent site with otherwise nonobstructive  coronary artery disease and normal left ventricular function.   The patient has multiple cardiac risk factors.  Notable for insulin-  dependent diabetes mellitus, hyperlipidemia, and ongoing tobacco  smoking.   The patient reports no exertional chest discomfort.  She seems to have  had symptoms suggestive of reflux disease and, since being placed on  Nexium by Dr. Juanetta Gosling, has had complete resolution of these symptoms.   The patient is prone to anxiety attacks as noted in the past, and at  such times feels that her heart may be running away from her.  However,  when she checks her pulse, she states that it is in the 70s.  The  patient had a monitor placed in 1998 revealing no ventricular ectopy and  a brief run of SVT.  The patient denies any recent exacerbation of these  palpitations.   CURRENT MEDICATIONS:  1. Humulin as directed.  2. Aspirin 81 mg daily.  3. Plavix 75 mg daily.  4. Toprol-XL 25 mg daily.  5. Nexium 20 mg daily.   REVIEW OF SYSTEMS:  Denies any recent paroxysmal nocturnal dyspnea,  orthopnea, or lower extremity edema.  Has some chronic, mild exertional  dyspnea.  Has occasional heartburn but no history of peptic ulcer  disease. Denies any evidence of overt bleeding.  Otherwise as noted per  HPI.  The remaining systems negative.   PHYSICAL EXAMINATION:  VITAL SIGNS:  Blood pressure 128/76.   Pulse 80  and regular.  Weight 90.  GENERAL:  A 60 year old female, sitting upright, in no acute distress.  NECK:  Palpable carotid pulses without bruits.  LUNGS:  Clear to auscultation in all fields.  HEART:  Regular rate and rhythm. (S1 and S2).  Soft S4.  No significant  murmurs.  EXTREMITIES:  Minimally palpable posterior tibialis pulses without  edema.  NEUROLOGIC:  Mildly anxious but with no focal deficit.   IMPRESSION:  1. Severe single vessel coronary artery disease.      a.     Cypher stenting right coronary artery October 2006: widely       patent by cardiac catheterization February 2007.      b.     Normal left ventricular function  2. Chronic obstructive pulmonary disease/ongoing tobacco.  3. Insulin-dependent diabetes mellitus.  4. Hyperlipidemia.      a.     Reported history of allergy to Crestor and Lipitor.  5. Anxiety disorder.  6. Longstanding history of palpitations.      a.     Negative Holter monitor in 1998.  PLAN:  1. The patient was strongly advised to reattempt smoking cessation,      which she reportedly has done in the past and states that she is      quite capable of doing so without the aid of Chantix, et al.  2. I emphasized to her the importance of being on a cholesterol-      lowering agent.  Therefore, she has agreed to take Zetia 10 mg      daily with close monitoring for any adverse effects.  Will then      check followup as liver profile in three months.  3. Return clinic followup with myself and Dr. Andee Lineman.      Sabrina Serpe, PA-C  Electronically Signed      Learta Codding, MD,FACC  Electronically Signed   GS/MedQ  DD: 06/18/2006  DT: 06/19/2006  Job #: [id number]   cc:   Ramon Dredge L. Juanetta Gosling, M.D.

## 2010-11-17 ENCOUNTER — Telehealth: Payer: Self-pay | Admitting: *Deleted

## 2010-11-17 NOTE — Telephone Encounter (Signed)
Spoke with patient and she wanted to know if her chart mentions whether or not she has ever had a heart attack. Nurse reviewed history and didn't find any diagnosis suggesting that the patient had a heart attack in the past. Patient informed.

## 2011-01-01 ENCOUNTER — Encounter: Payer: Self-pay | Admitting: Cardiology

## 2011-02-04 ENCOUNTER — Ambulatory Visit: Payer: Self-pay | Admitting: Cardiology

## 2011-02-25 ENCOUNTER — Ambulatory Visit: Payer: Self-pay | Admitting: Cardiology

## 2011-02-25 ENCOUNTER — Encounter: Payer: Self-pay | Admitting: Cardiology

## 2011-03-24 ENCOUNTER — Other Ambulatory Visit: Payer: Self-pay | Admitting: Cardiology

## 2011-04-01 ENCOUNTER — Telehealth: Payer: Self-pay | Admitting: *Deleted

## 2011-04-01 NOTE — Telephone Encounter (Signed)
Patient left message on voice mail to return call.  Left message to return call.

## 2011-04-14 NOTE — Telephone Encounter (Signed)
No answer.  OV scheduled for 10/25.

## 2011-04-16 LAB — CBC
HCT: 38.5
MCV: 94
Platelets: 311
RBC: 4.1
WBC: 6.9

## 2011-04-16 LAB — BASIC METABOLIC PANEL
BUN: 7
Chloride: 98
GFR calc Af Amer: >60
GFR calc non Af Amer: >60
Potassium: 4.1

## 2011-04-23 ENCOUNTER — Encounter: Payer: Self-pay | Admitting: Cardiology

## 2011-04-23 ENCOUNTER — Ambulatory Visit (INDEPENDENT_AMBULATORY_CARE_PROVIDER_SITE_OTHER): Payer: Medicare FFS | Admitting: Cardiology

## 2011-04-23 VITALS — BP 160/97 | HR 111 | Ht 62.0 in | Wt 91.0 lb

## 2011-04-23 DIAGNOSIS — C329 Malignant neoplasm of larynx, unspecified: Secondary | ICD-10-CM

## 2011-04-23 DIAGNOSIS — I2510 Atherosclerotic heart disease of native coronary artery without angina pectoris: Secondary | ICD-10-CM

## 2011-04-23 DIAGNOSIS — I259 Chronic ischemic heart disease, unspecified: Secondary | ICD-10-CM

## 2011-04-23 DIAGNOSIS — E785 Hyperlipidemia, unspecified: Secondary | ICD-10-CM

## 2011-04-23 NOTE — Patient Instructions (Signed)
Continue all current medications. Your physician wants you to follow up in: 6 months.  You will receive a reminder letter in the mail one-two months in advance.  If you don't receive a letter, please call our office to schedule the follow up appointment   

## 2011-04-24 NOTE — Progress Notes (Signed)
History of present illness:  The patient is a 60 year old female with a history of laryngeal cancer, status post tracheotomy. She has single-vessel coronary disease with a stent to the right coronary artery in 2007. She had a nonischemic Cardiolite in 2008. From a cardiac standpoint is doing well. She reports no palpitations or presyncope or syncope. She does have occasional atypical chest pain but this usually occurs in the setting of anxiety. She has no exertional symptoms. She has no orthopnea PND.   Allergies, family history and social history: As documented in chart and reviewed  Medications: Documented and reviewed in chart.  Past medical history: Reviewed and see problem list below   Review of systems: No nausea or vomiting. No fever or chills. No melena hematochezia. No dysuria or frequency. No claudication. No visual changes   Physical examination : Vital signs documented below General: Cachectic appearing white female HEENT: Tracheostomy in place, normal carotid upstroke no carotid bruits. JVP is 5 cm no abdominal jugular reflux. No thyromegaly nonnodular thyroid Lungs: Clear breath sounds bilaterally. Heart: Regular rate and rhythm with normal S1-S2 and no murmur rubs or gallops Abdomen: Soft and nontender with no rebound or guarding good bowel sounds Extremity exam: No cyanosis clubbing or edema Neurologic: Alert and oriented grossly nonfocal Psychiatric: Normal affect Vascular exam: Normal peripheral pulses  12-lead echocardiogram: Normal sinus rhythm with occasional PVCs Limited bedside echocardiogram normal LV and RV function with a left ventricle ejection fraction of 55-60%. No segmental wall motion abnormalities no gross valvular abnormalities.

## 2011-04-24 NOTE — Assessment & Plan Note (Signed)
Patient currently not on statin drug therapy. She's unable to tolerate statins as listed in her allergies

## 2011-04-24 NOTE — Assessment & Plan Note (Signed)
No recurrent chest pain. Continue medical therapy and risk factor modification. No indication for stress testing presently

## 2011-04-24 NOTE — Assessment & Plan Note (Signed)
Stable

## 2011-04-28 ENCOUNTER — Telehealth: Payer: Self-pay | Admitting: *Deleted

## 2011-04-28 NOTE — Telephone Encounter (Signed)
Message left on nurse's voicemail at 9:41am to call her back, saying she had a question about something Dr. Andee Lineman told her. Nurse called patient back and the phone rang and was unable to leave message.

## 2011-04-30 ENCOUNTER — Other Ambulatory Visit: Payer: Self-pay | Admitting: *Deleted

## 2011-04-30 MED ORDER — LEVOTHYROXINE SODIUM 50 MCG PO TABS: ORAL_TABLET | ORAL | Status: AC

## 2011-04-30 NOTE — Telephone Encounter (Signed)
Made attempt to call patient back again and phone just rang, no answer. Unable to reach patient.

## 2011-05-19 ENCOUNTER — Telehealth: Payer: Self-pay | Admitting: Cardiology

## 2011-05-27 NOTE — Telephone Encounter (Signed)
Patient did have hand held echo done in office by Dr. Andee Lineman.  Results were discussed with patient at time of visit.  Did make attempt to return call with no answer.

## 2011-06-05 ENCOUNTER — Telehealth: Payer: Self-pay | Admitting: *Deleted

## 2011-06-05 NOTE — Telephone Encounter (Signed)
Patient left message on voicemail regarding echo done in office.    Returned call to patient.  Discussed normal results.  Per Dr. Margarita Mail dictation - Limited bedside echocardiogram normal LV and RV function with a left ventricle ejection fraction of 55-60%. No segmental wall motion abnormalities no gross valvular abnormalities.  Stated she was having palpitations occasionally.  Advised her to continue to monitor.  Okay to have occasionally as she does have an anxiety component also.  If become symptomatic, such as dizziness or syncope, to notify office.  She verbalized understanding.

## 2011-08-07 ENCOUNTER — Telehealth: Payer: Self-pay | Admitting: *Deleted

## 2011-08-07 NOTE — Telephone Encounter (Signed)
Patient left message for return call regarding heart - blood pressure.    Returned call - no answer.

## 2011-09-09 ENCOUNTER — Telehealth: Payer: Self-pay | Admitting: *Deleted

## 2011-09-09 NOTE — Telephone Encounter (Signed)
[name] (friend) notified of below.  Advised him note below will be faxed to her ENT so they may prescribe a different antibiotic.    Dr. Vinson Moselle ENT  Phone:  [phone]  Tried to reach office above, closed at 4:30.  Will try calling in the morning to get fax number.

## 2011-09-09 NOTE — Telephone Encounter (Signed)
That is correct, patient could consider Bactrim as she does not appear to be allergic to sulfa drugs. She is allergic to penicillins our options are limited. Unfortunately other macrolides or fluoroquinolones can also prolonged QTC. It is prescribed for an upper respiratory infection I recommend Bactrim one double strength tablet twice a day for doxycycline 100 mg by mouth twice a day   Azithromycin: FDA Issues Cardiac Warning   The antibiotic azithromycin (Zithromax and Zmax) can cause QT interval prolongation and torsades de pointes, the FDA warned on Tuesday.  The agency says that healthcare providers should consider risk for fatal heart rhythms when treating patients already at high cardiovascular risk, including people with known prolongation of the QT interval, torsades de pointes, congenital long QT syndrome, bradyarrhythmias, or uncompensated heart failure; patients taking drugs that prolong the QT interval; and patients with proarrhythmic conditions (e.g., uncorrected hypokalemia). Older patients and patients with cardiac disease may also be at higher risk.  [name] 1:49 PM 09/09/2011

## 2011-09-09 NOTE — Telephone Encounter (Signed)
ENT MD prescribed Zithromax & she questions if okay to take.  Saw ad on TV that this med may not be safe for people with heart problems.

## 2011-09-10 NOTE — Telephone Encounter (Signed)
Fax:  [phone] Dr. Katrinka Blazing nurse - Vincenza Hews. Will fax below.

## 2012-01-07 ENCOUNTER — Ambulatory Visit: Payer: Medicare FFS | Admitting: Cardiovascular Disease

## 2013-06-06 ENCOUNTER — Other Ambulatory Visit (HOSPITAL_COMMUNITY): Payer: Self-pay | Admitting: Pulmonary Disease

## 2013-06-06 DIAGNOSIS — C099 Malignant neoplasm of tonsil, unspecified: Secondary | ICD-10-CM

## 2013-06-06 DIAGNOSIS — C329 Malignant neoplasm of larynx, unspecified: Secondary | ICD-10-CM

## 2013-06-08 ENCOUNTER — Ambulatory Visit (HOSPITAL_COMMUNITY): Payer: PRIVATE HEALTH INSURANCE

## 2013-06-12 ENCOUNTER — Ambulatory Visit (HOSPITAL_COMMUNITY)
Admission: RE | Admit: 2013-06-12 | Discharge: 2013-06-12 | Disposition: A | Discharge status: Home / Self Care | Payer: PRIVATE HEALTH INSURANCE | Source: Ambulatory Visit | Attending: Pulmonary Disease | Admitting: Pulmonary Disease

## 2013-06-12 DIAGNOSIS — J984 Other disorders of lung: Secondary | ICD-10-CM | POA: Insufficient documentation

## 2013-06-12 DIAGNOSIS — Z923 Personal history of irradiation: Secondary | ICD-10-CM | POA: Insufficient documentation

## 2013-06-12 DIAGNOSIS — C329 Malignant neoplasm of larynx, unspecified: Secondary | ICD-10-CM | POA: Insufficient documentation

## 2013-06-12 DIAGNOSIS — C099 Malignant neoplasm of tonsil, unspecified: Secondary | ICD-10-CM | POA: Insufficient documentation

## 2013-06-12 DIAGNOSIS — R911 Solitary pulmonary nodule: Secondary | ICD-10-CM | POA: Insufficient documentation

## 2014-04-29 ENCOUNTER — Emergency Department (HOSPITAL_COMMUNITY): Payer: PRIVATE HEALTH INSURANCE

## 2014-04-29 ENCOUNTER — Encounter (HOSPITAL_COMMUNITY): Payer: Self-pay

## 2014-04-29 ENCOUNTER — Inpatient Hospital Stay (HOSPITAL_COMMUNITY)
Admission: EM | Admit: 2014-04-29 | Discharge: 2014-05-06 | DRG: 193 | Disposition: A | Discharge status: Home Health | Payer: PRIVATE HEALTH INSURANCE | Attending: Pulmonary Disease | Admitting: Pulmonary Disease

## 2014-04-29 DIAGNOSIS — Z794 Long term (current) use of insulin: Secondary | ICD-10-CM

## 2014-04-29 DIAGNOSIS — Z9002 Acquired absence of larynx: Secondary | ICD-10-CM

## 2014-04-29 DIAGNOSIS — F22 Delusional disorders: Secondary | ICD-10-CM | POA: Diagnosis present

## 2014-04-29 DIAGNOSIS — E1165 Type 2 diabetes mellitus with hyperglycemia: Secondary | ICD-10-CM | POA: Diagnosis present

## 2014-04-29 DIAGNOSIS — R059 Cough, unspecified: Secondary | ICD-10-CM

## 2014-04-29 DIAGNOSIS — Z7982 Long term (current) use of aspirin: Secondary | ICD-10-CM

## 2014-04-29 DIAGNOSIS — I2510 Atherosclerotic heart disease of native coronary artery without angina pectoris: Secondary | ICD-10-CM | POA: Diagnosis present

## 2014-04-29 DIAGNOSIS — R52 Pain, unspecified: Secondary | ICD-10-CM

## 2014-04-29 DIAGNOSIS — J449 Chronic obstructive pulmonary disease, unspecified: Secondary | ICD-10-CM | POA: Diagnosis present

## 2014-04-29 DIAGNOSIS — E785 Hyperlipidemia, unspecified: Secondary | ICD-10-CM | POA: Diagnosis present

## 2014-04-29 DIAGNOSIS — R042 Hemoptysis: Secondary | ICD-10-CM | POA: Diagnosis present

## 2014-04-29 DIAGNOSIS — F419 Anxiety disorder, unspecified: Secondary | ICD-10-CM | POA: Diagnosis present

## 2014-04-29 DIAGNOSIS — R05 Cough: Secondary | ICD-10-CM | POA: Diagnosis not present

## 2014-04-29 DIAGNOSIS — Z7952 Long term (current) use of systemic steroids: Secondary | ICD-10-CM

## 2014-04-29 DIAGNOSIS — Z833 Family history of diabetes mellitus: Secondary | ICD-10-CM

## 2014-04-29 DIAGNOSIS — Z23 Encounter for immunization: Secondary | ICD-10-CM

## 2014-04-29 DIAGNOSIS — Z8521 Personal history of malignant neoplasm of larynx: Secondary | ICD-10-CM

## 2014-04-29 DIAGNOSIS — Z681 Body mass index (BMI) 19 or less, adult: Secondary | ICD-10-CM

## 2014-04-29 DIAGNOSIS — Z79899 Other long term (current) drug therapy: Secondary | ICD-10-CM

## 2014-04-29 DIAGNOSIS — IMO0002 Reserved for concepts with insufficient information to code with codable children: Secondary | ICD-10-CM | POA: Diagnosis present

## 2014-04-29 DIAGNOSIS — J189 Pneumonia, unspecified organism: Principal | ICD-10-CM | POA: Diagnosis present

## 2014-04-29 DIAGNOSIS — I259 Chronic ischemic heart disease, unspecified: Secondary | ICD-10-CM | POA: Diagnosis present

## 2014-04-29 DIAGNOSIS — E039 Hypothyroidism, unspecified: Secondary | ICD-10-CM | POA: Diagnosis present

## 2014-04-29 DIAGNOSIS — Z85118 Personal history of other malignant neoplasm of bronchus and lung: Secondary | ICD-10-CM

## 2014-04-29 DIAGNOSIS — Z930 Tracheostomy status: Secondary | ICD-10-CM

## 2014-04-29 DIAGNOSIS — Z87891 Personal history of nicotine dependence: Secondary | ICD-10-CM

## 2014-04-29 DIAGNOSIS — Z8249 Family history of ischemic heart disease and other diseases of the circulatory system: Secondary | ICD-10-CM

## 2014-04-29 DIAGNOSIS — C329 Malignant neoplasm of larynx, unspecified: Secondary | ICD-10-CM | POA: Diagnosis present

## 2014-04-29 DIAGNOSIS — F29 Unspecified psychosis not due to a substance or known physiological condition: Secondary | ICD-10-CM | POA: Diagnosis present

## 2014-04-29 DIAGNOSIS — R918 Other nonspecific abnormal finding of lung field: Secondary | ICD-10-CM

## 2014-04-29 DIAGNOSIS — Z923 Personal history of irradiation: Secondary | ICD-10-CM

## 2014-04-29 DIAGNOSIS — J440 Chronic obstructive pulmonary disease with acute lower respiratory infection: Secondary | ICD-10-CM | POA: Diagnosis present

## 2014-04-29 DIAGNOSIS — E118 Type 2 diabetes mellitus with unspecified complications: Secondary | ICD-10-CM

## 2014-04-29 DIAGNOSIS — E43 Unspecified severe protein-calorie malnutrition: Secondary | ICD-10-CM | POA: Diagnosis present

## 2014-04-29 LAB — URINALYSIS, ROUTINE W REFLEX MICROSCOPIC
GLUCOSE, UA: 500 mg/dL — AB
Ketones, ur: NEGATIVE mg/dL
Nitrite: NEGATIVE
PH: 5.5 (ref 5.0–8.0)
Protein, ur: NEGATIVE mg/dL
SPECIFIC GRAVITY, URINE: 1.025 (ref 1.005–1.030)
Urobilinogen, UA: 0.2 mg/dL (ref 0.0–1.0)

## 2014-04-29 LAB — COMPREHENSIVE METABOLIC PANEL
ALBUMIN: 3.4 g/dL — AB (ref 3.5–5.2)
ALK PHOS: 139 U/L — AB (ref 39–117)
ALT: 11 U/L (ref 0–35)
AST: 21 U/L (ref 0–37)
Anion gap: 14 (ref 5–15)
BUN: 39 mg/dL — ABNORMAL HIGH (ref 6–23)
CO2: 31 mEq/L (ref 19–32)
Calcium: 9.8 mg/dL (ref 8.4–10.5)
Chloride: 93 mEq/L — ABNORMAL LOW (ref 96–112)
Creatinine, Ser: 1.12 mg/dL — ABNORMAL HIGH (ref 0.50–1.10)
GFR calc Af Amer: 59 mL/min — ABNORMAL LOW (ref >90)
GFR calc non Af Amer: 51 mL/min — ABNORMAL LOW (ref >90)
GLUCOSE: 45 mg/dL — AB (ref 70–99)
POTASSIUM: 3.4 meq/L — AB (ref 3.7–5.3)
SODIUM: 138 meq/L (ref 137–147)
TOTAL PROTEIN: 7.9 g/dL (ref 6.0–8.3)
Total Bilirubin: 0.2 mg/dL — ABNORMAL LOW (ref 0.3–1.2)

## 2014-04-29 LAB — CBC WITH DIFFERENTIAL/PLATELET
BASOS ABS: 0 10*3/uL (ref 0.0–0.1)
BASOS PCT: 0 % (ref 0–1)
EOS ABS: 0 10*3/uL (ref 0.0–0.7)
Eosinophils Relative: 0 % (ref 0–5)
HCT: 30 % — ABNORMAL LOW (ref 36.0–46.0)
Hemoglobin: 10.1 g/dL — ABNORMAL LOW (ref 12.0–15.0)
LYMPHS ABS: 0.7 10*3/uL (ref 0.7–4.0)
Lymphocytes Relative: 5 % — ABNORMAL LOW (ref 12–46)
MCH: 28.5 pg (ref 26.0–34.0)
MCHC: 33.7 g/dL (ref 30.0–36.0)
MCV: 84.5 fL (ref 78.0–100.0)
Monocytes Absolute: 0.8 10*3/uL (ref 0.1–1.0)
Monocytes Relative: 6 % (ref 3–12)
NEUTROS PCT: 89 % — AB (ref 43–77)
Neutro Abs: 12.1 10*3/uL — ABNORMAL HIGH (ref 1.7–7.7)
PLATELETS: 502 10*3/uL — AB (ref 150–400)
RBC: 3.55 MIL/uL — AB (ref 3.87–5.11)
RDW: 16.2 % — AB (ref 11.5–15.5)
WBC: 13.6 10*3/uL — ABNORMAL HIGH (ref 4.0–10.5)

## 2014-04-29 LAB — URINE MICROSCOPIC-ADD ON

## 2014-04-29 LAB — CBG MONITORING, ED
GLUCOSE-CAPILLARY: 40 mg/dL — AB (ref 70–99)
Glucose-Capillary: 185 mg/dL — ABNORMAL HIGH (ref 70–99)

## 2014-04-29 MED ORDER — ALPRAZOLAM 0.5 MG PO TABS
ORAL_TABLET | ORAL | Status: AC
  Administered 2014-04-29: 0.5 mg
  Filled 2014-04-29: qty 1

## 2014-04-29 MED ORDER — DEXTROSE 50 % IV SOLN
50.0000 mL | Freq: Once | INTRAVENOUS | Status: AC
  Administered 2014-04-29: 50 mL via INTRAVENOUS

## 2014-04-29 MED ORDER — SODIUM CHLORIDE 0.9 % IV BOLUS (SEPSIS)
500.0000 mL | Freq: Once | INTRAVENOUS | Status: AC
  Administered 2014-04-29: 20:00:00 via INTRAVENOUS

## 2014-04-29 MED ORDER — DEXTROSE 50 % IV SOLN
INTRAVENOUS | Status: AC
  Filled 2014-04-29: qty 50

## 2014-04-29 MED ORDER — IOHEXOL 300 MG/ML  SOLN
100.0000 mL | Freq: Once | INTRAMUSCULAR | Status: AC | PRN
  Administered 2014-04-29: 80 mL via INTRAVENOUS

## 2014-04-29 MED ORDER — LEVOFLOXACIN IN D5W 500 MG/100ML IV SOLN
500.0000 mg | Freq: Once | INTRAVENOUS | Status: DC
  Filled 2014-04-29: qty 100

## 2014-04-29 MED ORDER — IPRATROPIUM-ALBUTEROL 0.5-2.5 (3) MG/3ML IN SOLN: 3.0000 mL | Freq: Once | RESPIRATORY_TRACT | Status: DC

## 2014-04-29 NOTE — ED Notes (Signed)
Patient has been sick for 2-3 days per pt. She is vomiting blood. Vomiting during triage.

## 2014-04-29 NOTE — ED Provider Notes (Signed)
CSN: [id number]     Arrival date & time 04/29/14  1842 History  This chart was scribed for Maudry Diego, MD by Dellis Filbert, ED Scribe. The patient was seen in APA02/APA02 and the patient's care was started at 7:25 PM.    Chief Complaint  Patient presents with  . Hematemesis    Patient is a 63 y.o. female presenting with vomiting. The history is provided by the patient. The history is limited by the condition of the patient. No language interpreter was used.  Emesis Severity:  Mild Duration:  3 days Timing:  Intermittent Quality:  Bright red blood Relieved by:  Nothing Worsened by:  Nothing tried Associated symptoms: no abdominal pain, no diarrhea and no headaches     HPI Comments: [name] is a 63 y.o. female with a history of a permanent tracheostomy and laryngectomy who presents to the Emergency Department complaining of blood in her vomit. She notes she has been sick for the past 3 day. She notes her trachea has a bloody discharge. She denies SOB and abdominal pain.   Past Medical History  Diagnosis Date  . Atypical chest pain     likely musculoskeletal  . CAD (coronary artery disease)     single vessel. S/P Cypher stent of the right coronary artery October 2006 and patent by catheterization October 2007. Normal left ventricular function. Normal exercise Cardiolite study of 67% May 2008  . Chronic obstructive pulmonary disease   . History of tobacco use     discontinued  . Dyslipidemia   . Insulin dependent diabetes mellitus   . 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 Anxiety disorder   . Hypothyroidism    Past Surgical History  Procedure Laterality Date  . Laryngectomy    . Permanent tracheostomy     Family History  Problem Relation Age of Onset  . Diabetes      No FH  . Hypertension    . Coronary artery disease     History  Substance Use Topics  . Smoking status: Former Smoker -- 1.50 packs/day for 30  years    Types: Cigarettes    Quit date: 06/29/2006  . Smokeless tobacco: Never Used  . Alcohol Use: Not on file   OB History    No data available     Review of Systems  Constitutional: Negative for appetite change and fatigue.  HENT: Negative for congestion, ear discharge and sinus pressure.   Eyes: Negative for discharge.  Respiratory: Negative for cough and shortness of breath.   Cardiovascular: Negative for chest pain.  Gastrointestinal: Positive for vomiting. Negative for abdominal pain and diarrhea.  Genitourinary: Negative for frequency and hematuria.  Musculoskeletal: Negative for back pain.  Skin: Negative for rash.  Neurological: Negative for seizures and headaches.  Psychiatric/Behavioral: Negative for hallucinations.      Allergies  Atorvastatin; Clopidogrel bisulfate; Codeine; and Penicillins  Home Medications   Prior to Admission medications   Medication Sig Start Date End Date Taking? Authorizing Provider  ALPRAZolam Duanne Moron) 0.5 MG tablet Take 0.5 mg by mouth 5 (five) times daily as needed.      Historical Provider, MD  aspirin 81 MG EC tablet Take 81 mg by mouth daily.      Historical Provider, MD  diphenhydrAMINE (BENADRYL) 25 MG tablet Take 25 mg by mouth every 6 (six) hours as needed.      Historical Provider, MD  esomeprazole ([place]) 20 MG  capsule Take 20 mg by mouth daily before breakfast.      Historical Provider, MD  HUMULIN R 100 UNIT/ML injection Inject into the skin Three times a day. 03/24/11   Historical Provider, MD  insulin NPH (HUMULIN N) 100 UNIT/ML injection Inject into the skin. UAD     Historical Provider, MD  levothyroxine (SYNTHROID, LEVOTHROID) 50 MCG tablet Take 1 1/2 tabs daily 04/30/11   Ezra Sites, MD  nitroGLYCERIN (NITROSTAT) 0.4 MG SL tablet Place 0.4 mg under the tongue every 5 (five) minutes as needed.      Historical Provider, MD  predniSONE (DELTASONE) 5 MG tablet Take 1 tablet by mouth as needed.  03/24/11   Historical  Provider, MD  promethazine (PHENERGAN) 25 MG tablet Take 25 mg by mouth as needed.      Historical Provider, MD  TOPROL XL 25 MG 24 hr tablet TAKE (1) TABLET ONCE DAILY AS NEEDED. 03/24/11   Ezra Sites, MD   There were no vitals taken for this visit. Physical Exam  Constitutional: She is oriented to person, place, and time. She appears well-developed.  HENT:  Head: Normocephalic.  Permanent tracheostomy  Eyes: Conjunctivae and EOM are normal. No scleral icterus.  Neck: Neck supple. No thyromegaly present.  Cardiovascular: Normal rate and regular rhythm.  Exam reveals no gallop and no friction rub.   No murmur heard. Pulmonary/Chest: No stridor. She has no wheezes. She has no rales. She exhibits no tenderness.  Wheezing bilaterally.   Abdominal: She exhibits no distension. There is no tenderness. There is no rebound.  Musculoskeletal: Normal range of motion. She exhibits no edema.  Lymphadenopathy:    She has no cervical adenopathy.  Neurological: She is oriented to person, place, and time. She exhibits normal muscle tone. Coordination normal.  Skin: No rash noted. No erythema.  Psychiatric: She has a normal mood and affect. Her behavior is normal.    ED Course  Procedures  DIAGNOSTIC STUDIES:    COORDINATION OF CARE: 7:30 PM Discussed treatment plan with pt at bedside and pt agreed to plan.   Labs Review Labs Reviewed - No data to display  Imaging Review No results found.   EKG Interpretation None      MDM   Final diagnoses:  None    Admit for lung mass   The chart was scribed for me under my direct supervision.  I personally performed the history, physical, and medical decision making and all procedures in the evaluation of this patient.Maudry Diego, MD 04/30/14 [phone]

## 2014-04-29 NOTE — Progress Notes (Signed)
I spoke with patient about a HHN and she refused a treatment. I spoke with patient about being suctioned and she refused that because she has a good strong cough. I offered to set patient up on a aersol collar but she refused that. Patient is saturation is 100% on room air.

## 2014-04-30 ENCOUNTER — Encounter (HOSPITAL_COMMUNITY): Payer: Self-pay | Admitting: *Deleted

## 2014-04-30 ENCOUNTER — Emergency Department (HOSPITAL_COMMUNITY): Payer: PRIVATE HEALTH INSURANCE

## 2014-04-30 ENCOUNTER — Observation Stay (HOSPITAL_COMMUNITY): Payer: PRIVATE HEALTH INSURANCE

## 2014-04-30 DIAGNOSIS — F419 Anxiety disorder, unspecified: Secondary | ICD-10-CM

## 2014-04-30 DIAGNOSIS — Z930 Tracheostomy status: Secondary | ICD-10-CM

## 2014-04-30 DIAGNOSIS — J449 Chronic obstructive pulmonary disease, unspecified: Secondary | ICD-10-CM

## 2014-04-30 DIAGNOSIS — Z9002 Acquired absence of larynx: Secondary | ICD-10-CM

## 2014-04-30 DIAGNOSIS — J189 Pneumonia, unspecified organism: Principal | ICD-10-CM

## 2014-04-30 DIAGNOSIS — R918 Other nonspecific abnormal finding of lung field: Secondary | ICD-10-CM

## 2014-04-30 DIAGNOSIS — Z9889 Other specified postprocedural states: Secondary | ICD-10-CM

## 2014-04-30 DIAGNOSIS — R042 Hemoptysis: Secondary | ICD-10-CM | POA: Diagnosis present

## 2014-04-30 LAB — GLUCOSE, CAPILLARY
GLUCOSE-CAPILLARY: 367 mg/dL — AB (ref 70–99)
GLUCOSE-CAPILLARY: 476 mg/dL — AB (ref 70–99)
GLUCOSE-CAPILLARY: 75 mg/dL (ref 70–99)
Glucose-Capillary: 137 mg/dL — ABNORMAL HIGH (ref 70–99)
Glucose-Capillary: 227 mg/dL — ABNORMAL HIGH (ref 70–99)
Glucose-Capillary: 280 mg/dL — ABNORMAL HIGH (ref 70–99)
Glucose-Capillary: 367 mg/dL — ABNORMAL HIGH (ref 70–99)
Glucose-Capillary: 451 mg/dL — ABNORMAL HIGH (ref 70–99)
Glucose-Capillary: 470 mg/dL — ABNORMAL HIGH (ref 70–99)
Glucose-Capillary: 476 mg/dL — ABNORMAL HIGH (ref 70–99)
Glucose-Capillary: 519 mg/dL — ABNORMAL HIGH (ref 70–99)
Glucose-Capillary: 597 mg/dL (ref 70–99)
Glucose-Capillary: >600 mg/dL (ref 70–99)

## 2014-04-30 LAB — CBC
HEMATOCRIT: 27.4 % — AB (ref 36.0–46.0)
HEMOGLOBIN: 9 g/dL — AB (ref 12.0–15.0)
MCH: 28.3 pg (ref 26.0–34.0)
MCHC: 32.8 g/dL (ref 30.0–36.0)
MCV: 86.2 fL (ref 78.0–100.0)
Platelets: 464 10*3/uL — ABNORMAL HIGH (ref 150–400)
RBC: 3.18 MIL/uL — ABNORMAL LOW (ref 3.87–5.11)
RDW: 16.6 % — ABNORMAL HIGH (ref 11.5–15.5)
WBC: 16 10*3/uL — ABNORMAL HIGH (ref 4.0–10.5)

## 2014-04-30 LAB — BASIC METABOLIC PANEL
ANION GAP: 14 (ref 5–15)
BUN: 31 mg/dL — AB (ref 6–23)
CHLORIDE: 87 meq/L — AB (ref 96–112)
CO2: 30 mEq/L (ref 19–32)
CREATININE: 0.92 mg/dL (ref 0.50–1.10)
Calcium: 9.4 mg/dL (ref 8.4–10.5)
GFR calc Af Amer: 75 mL/min — ABNORMAL LOW (ref >90)
GFR calc non Af Amer: 64 mL/min — ABNORMAL LOW (ref >90)
GLUCOSE: 522 mg/dL — AB (ref 70–99)
Potassium: 4 mEq/L (ref 3.7–5.3)
Sodium: 131 mEq/L — ABNORMAL LOW (ref 137–147)

## 2014-04-30 LAB — CBG MONITORING, ED: Glucose-Capillary: 63 mg/dL — ABNORMAL LOW (ref 70–99)

## 2014-04-30 MED ORDER — PREDNISONE 10 MG PO TABS: 5.0000 mg | ORAL_TABLET | ORAL | Status: DC | PRN

## 2014-04-30 MED ORDER — IPRATROPIUM-ALBUTEROL 0.5-2.5 (3) MG/3ML IN SOLN
3.0000 mL | Freq: Two times a day (BID) | RESPIRATORY_TRACT | Status: DC
  Filled 2014-04-30: qty 3

## 2014-04-30 MED ORDER — PANTOPRAZOLE SODIUM 40 MG PO TBEC
40.0000 mg | DELAYED_RELEASE_TABLET | Freq: Two times a day (BID) | ORAL | Status: DC
  Administered 2014-04-30 – 2014-05-06 (×9): 40 mg via ORAL
  Filled 2014-04-30 (×11): qty 1

## 2014-04-30 MED ORDER — SODIUM CHLORIDE 0.9 % IV SOLN
INTRAVENOUS | Status: AC
  Administered 2014-04-30: 03:00:00 via INTRAVENOUS

## 2014-04-30 MED ORDER — IOHEXOL 300 MG/ML  SOLN
75.0000 mL | Freq: Once | INTRAMUSCULAR | Status: AC | PRN
  Administered 2014-04-30: 75 mL via INTRAVENOUS

## 2014-04-30 MED ORDER — PNEUMOCOCCAL VAC POLYVALENT 25 MCG/0.5ML IJ INJ
0.5000 mL | INJECTION | INTRAMUSCULAR | Status: AC
  Administered 2014-05-02: 0.5 mL via INTRAMUSCULAR
  Filled 2014-04-30: qty 0.5

## 2014-04-30 MED ORDER — DEXTROSE 50 % IV SOLN
INTRAVENOUS | Status: AC
  Filled 2014-04-30: qty 50

## 2014-04-30 MED ORDER — DIPHENHYDRAMINE HCL 25 MG PO TABS
25.0000 mg | ORAL_TABLET | Freq: Four times a day (QID) | ORAL | Status: DC | PRN
  Filled 2014-04-30: qty 1

## 2014-04-30 MED ORDER — IPRATROPIUM-ALBUTEROL 0.5-2.5 (3) MG/3ML IN SOLN
3.0000 mL | Freq: Four times a day (QID) | RESPIRATORY_TRACT | Status: DC
  Filled 2014-04-30: qty 3

## 2014-04-30 MED ORDER — LEVOFLOXACIN IN D5W 250 MG/50ML IV SOLN
250.0000 mg | INTRAVENOUS | Status: DC
Start: 2014-05-01 — End: 2014-05-04
  Administered 2014-05-01 – 2014-05-03 (×3): 250 mg via INTRAVENOUS
  Filled 2014-04-30 (×8): qty 50

## 2014-04-30 MED ORDER — ONDANSETRON HCL 4 MG/2ML IJ SOLN: 4.0000 mg | Freq: Four times a day (QID) | INTRAMUSCULAR | Status: DC | PRN

## 2014-04-30 MED ORDER — ENSURE COMPLETE PO LIQD
237.0000 mL | Freq: Two times a day (BID) | ORAL | Status: DC
  Administered 2014-05-01 – 2014-05-03 (×5): 237 mL via ORAL

## 2014-04-30 MED ORDER — IPRATROPIUM-ALBUTEROL 0.5-2.5 (3) MG/3ML IN SOLN
3.0000 mL | Freq: Four times a day (QID) | RESPIRATORY_TRACT | Status: DC | PRN
  Administered 2014-05-02 – 2014-05-06 (×11): 3 mL via RESPIRATORY_TRACT
  Filled 2014-04-30 (×13): qty 3

## 2014-04-30 MED ORDER — INFLUENZA VAC SPLIT QUAD 0.5 ML IM SUSY
0.5000 mL | PREFILLED_SYRINGE | INTRAMUSCULAR | Status: AC
  Administered 2014-05-02: 0.5 mL via INTRAMUSCULAR
  Filled 2014-04-30: qty 0.5

## 2014-04-30 MED ORDER — INSULIN ASPART 100 UNIT/ML ~~LOC~~ SOLN
2.0000 [IU] | Freq: Once | SUBCUTANEOUS | Status: AC
  Administered 2014-04-30: 2 [IU] via SUBCUTANEOUS

## 2014-04-30 MED ORDER — KCL IN DEXTROSE-NACL 20-5-0.45 MEQ/L-%-% IV SOLN
INTRAVENOUS | Status: DC
  Filled 2014-04-30: qty 1000

## 2014-04-30 MED ORDER — ALPRAZOLAM 0.5 MG PO TABS
0.5000 mg | ORAL_TABLET | Freq: Every day | ORAL | Status: DC
  Administered 2014-04-30 – 2014-05-01 (×3): 0.5 mg via ORAL
  Filled 2014-04-30 (×6): qty 1

## 2014-04-30 MED ORDER — INSULIN ASPART 100 UNIT/ML ~~LOC~~ SOLN
1.0000 [IU] | SUBCUTANEOUS | Status: DC
  Administered 2014-04-30 (×2): 1 [IU] via SUBCUTANEOUS

## 2014-04-30 MED ORDER — DIPHENHYDRAMINE HCL 25 MG PO CAPS: 25.0000 mg | ORAL_CAPSULE | Freq: Four times a day (QID) | ORAL | Status: DC | PRN

## 2014-04-30 MED ORDER — ONDANSETRON HCL 4 MG PO TABS: 4.0000 mg | ORAL_TABLET | Freq: Four times a day (QID) | ORAL | Status: DC | PRN

## 2014-04-30 MED ORDER — PROMETHAZINE HCL 12.5 MG PO TABS: 25.0000 mg | ORAL_TABLET | Freq: Four times a day (QID) | ORAL | Status: DC

## 2014-04-30 MED ORDER — ADULT MULTIVITAMIN W/MINERALS CH
1.0000 | ORAL_TABLET | Freq: Every day | ORAL | Status: DC
  Administered 2014-04-30 – 2014-05-06 (×7): 1 via ORAL
  Filled 2014-04-30 (×7): qty 1

## 2014-04-30 MED ORDER — ALBUTEROL SULFATE (2.5 MG/3ML) 0.083% IN NEBU
INHALATION_SOLUTION | RESPIRATORY_TRACT | Status: AC
  Administered 2014-04-30: 2.5 mg
  Filled 2014-04-30: qty 3

## 2014-04-30 MED ORDER — INSULIN ASPART 100 UNIT/ML ~~LOC~~ SOLN
4.0000 [IU] | SUBCUTANEOUS | Status: AC
  Administered 2014-04-30: 4 [IU] via SUBCUTANEOUS

## 2014-04-30 MED ORDER — LEVOFLOXACIN IN D5W 500 MG/100ML IV SOLN
500.0000 mg | Freq: Once | INTRAVENOUS | Status: AC
  Administered 2014-04-30: 500 mg via INTRAVENOUS
  Filled 2014-04-30: qty 100

## 2014-04-30 MED ORDER — ALBUTEROL SULFATE (2.5 MG/3ML) 0.083% IN NEBU: 2.5000 mg | INHALATION_SOLUTION | RESPIRATORY_TRACT | Status: DC | PRN

## 2014-04-30 MED ORDER — INSULIN ASPART 100 UNIT/ML ~~LOC~~ SOLN
1.0000 [IU] | SUBCUTANEOUS | Status: DC
  Administered 2014-05-01: 8 [IU] via SUBCUTANEOUS
  Administered 2014-05-01 (×2): 6 [IU] via SUBCUTANEOUS
  Administered 2014-05-01: 2 [IU] via SUBCUTANEOUS
  Administered 2014-05-02: 5 [IU] via SUBCUTANEOUS

## 2014-04-30 MED ORDER — DEXTROSE 50 % IV SOLN
50.0000 mL | Freq: Once | INTRAVENOUS | Status: AC
  Administered 2014-04-30: 50 mL via INTRAVENOUS

## 2014-04-30 NOTE — Progress Notes (Signed)
UR completed 

## 2014-04-30 NOTE — BH Assessment (Signed)
Tele Assessment Note   [name] is an 63 y.o. female. Pt presented voluntarily presented to [facility] with chief complaint of vomiting blood. Pt is now on a medical floor at [facility]. Pt is oriented x 4 and is cooperative. Pt uses an electronic voice box, so it is quite difficult to understand pt's speech. RN is bedside to pt to assist in communication. Pt reports euthymic mood and her affect is blunted. Pt denies SI and HI. She denies [facility]. She reports that parasites are "coming out from under my skin." Pt sts that she is being bitten by parasites in her home. She says that she had a baby possum and a rat in her house and they transmitted the parasites. Per Dr Luan Pulling' note on 04/30/14, "She has had skin lesions and seen multiple dermatologists with nothing being found." She says she recently saw a psychiatrist to "make sure I was okay." Writer ran pt by Catalina Pizza NP who recommends inpatient geropsych placement.   Axis I: Delusional Disorder Axis II: Deferred Axis III:  Past Medical History  Diagnosis Date  . Atypical chest pain     likely musculoskeletal  . CAD (coronary artery disease)     single vessel. S/P Cypher stent of the right coronary artery October 2006 and patent by catheterization October 2007. Normal left ventricular function. Normal exercise Cardiolite study of 67% May 2008  . Chronic obstructive pulmonary disease   . History of tobacco use     discontinued  . Dyslipidemia   . Insulin dependent diabetes mellitus   . 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 Anxiety disorder   . Hypothyroidism    Axis IV: other psychosocial or environmental problems, problems related to social environment and problems with primary support group Axis V: 31-40 impairment in reality testing  Past Medical History:  Past Medical History  Diagnosis Date  . Atypical chest pain     likely musculoskeletal  . CAD (coronary artery disease)     single vessel.  S/P Cypher stent of the right coronary artery October 2006 and patent by catheterization October 2007. Normal left ventricular function. Normal exercise Cardiolite study of 67% May 2008  . Chronic obstructive pulmonary disease   . History of tobacco use     discontinued  . Dyslipidemia   . Insulin dependent diabetes mellitus   . 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 Anxiety disorder   . Hypothyroidism     Past Surgical History  Procedure Laterality Date  . Laryngectomy    . Permanent tracheostomy      Family History:  Family History  Problem Relation Age of Onset  . Diabetes      No FH  . Hypertension    . Coronary artery disease      Social History:  reports that she quit smoking about 7 years ago. Her smoking use included Cigarettes. She has a 45 pack-year smoking history. She has never used smokeless tobacco. Her alcohol and drug histories are not on file.  Additional Social History:     CIWA: CIWA-Ar BP: (!) 99/41 mmHg Pulse Rate: 91 COWS:    PATIENT STRENGTHS: (choose at least two) Average or above average intelligence Capable of independent living  Allergies:  Allergies  Allergen Reactions  . Atorvastatin Other (See Comments)    Reaction is unknown  . Clopidogrel Bisulfate Other (See Comments)    Reaction unknown  .  Oxycodone-Acetaminophen Nausea Only and Nausea And Vomiting  . Penicillins Other (See Comments)    Reaction is uknown  . Vancomycin Itching  . Codeine Rash    Home Medications:  Medications Prior to Admission  Medication Sig Dispense Refill  . ALPRAZolam (XANAX) 0.5 MG tablet Take 0.5 mg by mouth 6 (six) times daily. *May take up 7 times daily as needed for anxiety    . esomeprazole (NEXIUM) 20 MG capsule Take 20 mg by mouth daily as needed (for acid reflux). Patient states that she has to open capsule and mix with applesauce to take    . insulin NPH (HUMULIN N) 100 UNIT/ML injection Inject 10 Units  into the skin 2 (two) times daily as needed. Use as directed based on sliding scale instructions    . promethazine (PHENERGAN) 25 MG tablet Take 25 mg by mouth 4 (four) times daily.     Marland Kitchen aspirin 81 MG EC tablet Take 81 mg by mouth daily.      . diphenhydrAMINE (BENADRYL) 25 MG tablet Take 25 mg by mouth every 6 (six) hours as needed for allergies.     Marland Kitchen HUMULIN R 100 UNIT/ML injection Inject 2 Units into the skin 2 (two) times daily as needed for high blood sugar.     . levothyroxine (SYNTHROID, LEVOTHROID) 50 MCG tablet Take 1 1/2 tabs daily    . nitroGLYCERIN (NITROSTAT) 0.4 MG SL tablet Place 0.4 mg under the tongue every 5 (five) minutes as needed.      . predniSONE (DELTASONE) 5 MG tablet Take 1 tablet by mouth as needed (for allergic reactions).     . TOPROL XL 25 MG 24 hr tablet TAKE (1) TABLET ONCE DAILY AS NEEDED. 30 each 6    OB/GYN Status:  No LMP recorded.  General Assessment Data Location of Assessment: [facility] (med floor) Is this a Tele or Face-to-Face Assessment?: Tele Assessment Is this an Initial Assessment or a Re-assessment for this encounter?: Initial Assessment Living Arrangements: Alone Can pt return to current living arrangement?: Yes Admission Status: Voluntary Is patient capable of signing voluntary admission?: Yes Transfer from: Home Referral Source: Self/Family/Friend     [place] Living Arrangements: Alone Name of Psychiatrist: none Name of Therapist: none  Education Status Is patient currently in school?: No  Risk to self with the past 6 months Suicidal Ideation: No Suicidal Intent: No Is patient at risk for suicide?: No Suicidal Plan?: No Access to Means: No What has been your use of drugs/alcohol within the last 12 months?: none Previous Attempts/Gestures: No How many times?: 0 Other Self Harm Risks: none Triggers for Past Attempts:  (n/a) Intentional Self Injurious Behavior: None Family Suicide History: No Recent stressful life  event(s): Other (Comment) (parasites in her body) Persecutory voices/beliefs?: No Depression: No Substance abuse history and/or treatment for substance abuse?: No Suicide prevention information given to non-admitted patients: Not applicable  Risk to Others within the past 6 months Homicidal Ideation: No Thoughts of Harm to Others: No Current Homicidal Intent: No Current Homicidal Plan: No Access to Homicidal Means: No Identified Victim: none History of harm to others?: No Assessment of Violence: None Noted Violent Behavior Description: pt denies hx violence Does patient have access to weapons?: No Criminal Charges Pending?: No Does patient have a court date: No  Psychosis Hallucinations: None noted Delusions:  (reports parasites are attacking her body)  Mental Status Report Appear/Hygiene: Unremarkable Eye Contact: Good Motor Activity: Freedom of movement Speech: Logical/coherent, Other (  Comment) (uses electronic voice box so very difficult to understand) Level of Consciousness: Quiet/awake, Alert Mood: Euthymic Affect: Blunted Anxiety Level: None Thought Processes: Relevant, Coherent Judgement: Partial Orientation: Place, Person, Situation, Time Obsessive Compulsive Thoughts/Behaviors: Moderate  Cognitive Functioning Concentration: Normal Memory: Recent Intact, Remote Intact IQ: Average Insight: Poor Impulse Control: Poor Appetite: Fair Sleep: No Change  ADLScreening [facility] Assessment Services) Patient's cognitive ability adequate to safely complete daily activities?: Yes Patient able to express need for assistance with ADLs?: Yes Independently performs ADLs?: Yes (appropriate for developmental age)  Prior Inpatient Therapy Prior Inpatient Therapy: Yes Prior Therapy Facilty/Provider(s): in [place] Reason for Treatment: delusions  Prior Outpatient Therapy Prior Outpatient Therapy: Yes Prior Therapy Dates: recently Prior Therapy Facilty/Provider(s):  unknown Reason for Treatment: wellness check per pt  ADL Screening (condition at time of admission) Patient's cognitive ability adequate to safely complete daily activities?: Yes Is the patient deaf or have difficulty hearing?: No Does the patient have difficulty seeing, even when wearing glasses/contacts?: No Does the patient have difficulty concentrating, remembering, or making decisions?: No Patient able to express need for assistance with ADLs?: Yes Does the patient have difficulty dressing or bathing?: No Independently performs ADLs?: Yes (appropriate for developmental age) Does the patient have difficulty walking or climbing stairs?: No Weakness of Legs: None Weakness of Arms/Hands: None  Home Assistive Devices/Equipment Home Assistive Devices/Equipment: CBG Meter, Dentures (specify type)  Therapy Consults (therapy consults require a physician order) PT Evaluation Needed: Yes (Comment) OT Evalulation Needed: Yes (Comment) SLP Evaluation Needed: No Abuse/Neglect Assessment (Assessment to be complete while patient is alone) Physical Abuse: Denies Verbal Abuse: Denies Sexual Abuse: Denies Exploitation of patient/patient's resources: Denies Self-Neglect: Denies Values / Beliefs Cultural Requests During Hospitalization: None Spiritual Requests During Hospitalization: None Consults Spiritual Care Consult Needed: No Social Work Consult Needed: No Regulatory affairs officer (For Healthcare) Does patient have an advance directive?: No Would patient like information on creating an advanced directive?: No - patient declined information Nutrition Screen- [facility] Adult/[facility] Patient's home diet: Regular, Mechanical soft Have you recently lost weight without trying?: Yes If yes, how much weight have you lost?: 34 lb or more (over last year) Have you been eating poorly because of a decreased appetite?: Yes Malnutrition Screening Tool Score: 5  Additional Information 1:1 In Past 12 Months?:  No CIRT Risk: No Elopement Risk: No Does patient have medical clearance?: Yes     Disposition:  Disposition Initial Assessment Completed for this Encounter: Yes Disposition of Patient: Inpatient treatment program (conrad withrow np rec geropsychiatric placement)  Marrie Chandra P 04/30/2014 1:38 PM

## 2014-04-30 NOTE — Care Management Note (Unsigned)
    Page 1 of 1   05/04/2014     11:25:17 AM CARE MANAGEMENT NOTE 05/04/2014  Patient:  LAFERN, BRINKLEY   Account Number:  [id number]  Date Initiated:  04/30/2014  Documentation initiated by:  Theophilus Kinds  Subjective/Objective Assessment:   Pt admitted from home with pneumonia. Pt lives alone and will return home at discharge. Pt has a neighbor who helps pt out when needed. Pt stated that her house is infested with something and her neighbor is having someone come look at her     Action/Plan:   house. TTS is recommending inpatient psych treatment. Will continue to follow for discharge planning needs.   Anticipated DC Date:  05/02/2014   Anticipated DC Plan:  [place]  CM consult      Choice offered to / List presented to:             Status of service:  In process, will continue to follow Medicare Important Message given?  YES (If response is "NO", the following Medicare IM given date fields will be blank) Date Medicare IM given:  05/04/2014 Medicare IM given by:  Theophilus Kinds Date Additional Medicare IM given:   Additional Medicare IM given by:    Discharge Disposition:  [place]  Per UR Regulation:    If discussed at Long Length of Stay Meetings, dates discussed:    Comments:  05/04/14 [place], RN BSN CM Pt potential discharge over the weekend. CM spoke with pt about arranging [place] and pt would like to wait until her preacher comes to discuss this furthur. If pts pastor does not come while CM inhouse, weekend staff can arranged [place]. Will continue to follow.  04/30/14 [place], RN BSN CM

## 2014-04-30 NOTE — Progress Notes (Signed)
Subjective: She is here with pneumonia but she says the only reason she is really here is because her house is being checked for parasites. She has had skin lesions and seen multiple dermatologists with nothing being found. She is convinced that she has some sort of infestation and I think what she actually has his delusions of parasitosis. She is absolutely unwilling to consider that as a possible diagnosis. She is currently refusing antibiotics.  Objective: Vital signs in last 24 hours: Temp:  [97.9 F (36.6 C)-99.2 F (37.3 C)] 98.2 F (36.8 C) (11/02 0500) Pulse Rate:  [79-91] 91 (11/02 0500) Resp:  [12-20] 18 (11/02 0500) BP: (85-114)/(41-64) 99/41 mmHg (11/02 0500) SpO2:  [95 %-100 %] 98 % (11/02 0500) Weight:  [37.2 kg (82 lb 0.2 oz)] 37.2 kg (82 lb 0.2 oz) (11/02 0208) Weight change:  Last BM Date: 04/29/14  Intake/Output from previous day:    PHYSICAL EXAM General appearance: alert, cooperative and mild distress Resp: clear to auscultation bilaterally Cardio: regular rate and rhythm, S1, S2 normal, no murmur, click, rub or gallop GI: soft, non-tender; bowel sounds normal; no masses,  no organomegaly Extremities: extremities normal, atraumatic, no cyanosis or edema  Lab Results:  Results for orders placed or performed during the hospital encounter of 04/29/14 (from the past 48 hour(s))  CBC with Differential     Status: Abnormal   Collection Time: 04/29/14  7:47 PM  Result Value Ref Range   WBC 13.6 (H) 4.0 - 10.5 K/uL   RBC 3.55 (L) 3.87 - 5.11 MIL/uL   Hemoglobin 10.1 (L) 12.0 - 15.0 g/dL   HCT 30.0 (L) 36.0 - 46.0 %   MCV 84.5 78.0 - 100.0 fL   MCH 28.5 26.0 - 34.0 pg   MCHC 33.7 30.0 - 36.0 g/dL   RDW 16.2 (H) 11.5 - 15.5 %   Platelets 502 (H) 150 - 400 K/uL   Neutrophils Relative % 89 (H) 43 - 77 %   Neutro Abs 12.1 (H) 1.7 - 7.7 K/uL   Lymphocytes Relative 5 (L) 12 - 46 %   Lymphs Abs 0.7 0.7 - 4.0 K/uL   Monocytes Relative 6 3 - 12 %   Monocytes Absolute  0.8 0.1 - 1.0 K/uL   Eosinophils Relative 0 0 - 5 %   Eosinophils Absolute 0.0 0.0 - 0.7 K/uL   Basophils Relative 0 0 - 1 %   Basophils Absolute 0.0 0.0 - 0.1 K/uL  Comprehensive metabolic panel     Status: Abnormal   Collection Time: 04/29/14  7:47 PM  Result Value Ref Range   Sodium 138 137 - 147 mEq/L   Potassium 3.4 (L) 3.7 - 5.3 mEq/L   Chloride 93 (L) 96 - 112 mEq/L   CO2 31 19 - 32 mEq/L   Glucose, Bld 45 (L) 70 - 99 mg/dL   BUN 39 (H) 6 - 23 mg/dL   Creatinine, Ser 1.12 (H) 0.50 - 1.10 mg/dL   Calcium 9.8 8.4 - 10.5 mg/dL   Total Protein 7.9 6.0 - 8.3 g/dL   Albumin 3.4 (L) 3.5 - 5.2 g/dL   AST 21 0 - 37 U/L   ALT 11 0 - 35 U/L   Alkaline Phosphatase 139 (H) 39 - 117 U/L   Total Bilirubin 0.2 (L) 0.3 - 1.2 mg/dL   GFR calc non Af Amer 51 (L) >90 mL/min   GFR calc Af Amer 59 (L) >90 mL/min    Comment: (NOTE) The eGFR has been calculated  using the CKD EPI equation. This calculation has not been validated in all clinical situations. eGFR's persistently <90 mL/min signify possible Chronic Kidney Disease.    Anion gap 14 5 - 15  CBG monitoring, ED     Status: Abnormal   Collection Time: 04/29/14  7:52 PM  Result Value Ref Range   Glucose-Capillary 40 (LL) 70 - 99 mg/dL  CBG monitoring, ED     Status: Abnormal   Collection Time: 04/29/14  8:30 PM  Result Value Ref Range   Glucose-Capillary 185 (H) 70 - 99 mg/dL  Urinalysis, Routine w reflex microscopic     Status: Abnormal   Collection Time: 04/29/14  9:10 PM  Result Value Ref Range   Color, Urine YELLOW YELLOW   APPearance CLOUDY (A) CLEAR   Specific Gravity, Urine 1.025 1.005 - 1.030   pH 5.5 5.0 - 8.0   Glucose, UA 500 (A) NEGATIVE mg/dL   Hgb urine dipstick SMALL (A) NEGATIVE   Bilirubin Urine SMALL (A) NEGATIVE   Ketones, ur NEGATIVE NEGATIVE mg/dL   Protein, ur NEGATIVE NEGATIVE mg/dL   Urobilinogen, UA 0.2 0.0 - 1.0 mg/dL   Nitrite NEGATIVE NEGATIVE   Leukocytes, UA SMALL (A) NEGATIVE  Urine  microscopic-add on     Status: Abnormal   Collection Time: 04/29/14  9:10 PM  Result Value Ref Range   Squamous Epithelial / LPF FEW (A) RARE   WBC, UA 7-10 <3 WBC/hpf   RBC / HPF 3-6 <3 RBC/hpf   Bacteria, UA MANY (A) RARE  CBG monitoring, ED     Status: Abnormal   Collection Time: 04/30/14 12:21 AM  Result Value Ref Range   Glucose-Capillary 63 (L) 70 - 99 mg/dL  Glucose, capillary     Status: Abnormal   Collection Time: 04/30/14  1:15 AM  Result Value Ref Range   Glucose-Capillary 227 (H) 70 - 99 mg/dL   Comment 1 Notify RN   Glucose, capillary     Status: Abnormal   Collection Time: 04/30/14  4:08 AM  Result Value Ref Range   Glucose-Capillary 367 (H) 70 - 99 mg/dL   Comment 1 Notify RN   Glucose, capillary     Status: Abnormal   Collection Time: 04/30/14  5:17 AM  Result Value Ref Range   Glucose-Capillary 470 (H) 70 - 99 mg/dL  Basic metabolic panel     Status: Abnormal   Collection Time: 04/30/14  6:01 AM  Result Value Ref Range   Sodium 131 (L) 137 - 147 mEq/L    Comment: DELTA CHECK NOTED   Potassium 4.0 3.7 - 5.3 mEq/L   Chloride 87 (L) 96 - 112 mEq/L   CO2 30 19 - 32 mEq/L   Glucose, Bld 522 (H) 70 - 99 mg/dL   BUN 31 (H) 6 - 23 mg/dL   Creatinine, Ser 0.47 0.50 - 1.10 mg/dL   Calcium 9.4 8.4 - 37.2 mg/dL   GFR calc non Af Amer 64 (L) >90 mL/min   GFR calc Af Amer 75 (L) >90 mL/min    Comment: (NOTE) The eGFR has been calculated using the CKD EPI equation. This calculation has not been validated in all clinical situations. eGFR's persistently <90 mL/min signify possible Chronic Kidney Disease.    Anion gap 14 5 - 15  CBC     Status: Abnormal   Collection Time: 04/30/14  6:01 AM  Result Value Ref Range   WBC 16.0 (H) 4.0 - 10.5 K/uL   RBC 3.18 (L)  3.87 - 5.11 MIL/uL   Hemoglobin 9.0 (L) 12.0 - 15.0 g/dL   HCT 27.4 (L) 36.0 - 46.0 %   MCV 86.2 78.0 - 100.0 fL   MCH 28.3 26.0 - 34.0 pg   MCHC 32.8 30.0 - 36.0 g/dL   RDW 16.6 (H) 11.5 - 15.5 %    Platelets 464 (H) 150 - 400 K/uL  Glucose, capillary     Status: Abnormal   Collection Time: 04/30/14  6:55 AM  Result Value Ref Range   Glucose-Capillary 476 (H) 70 - 99 mg/dL  Glucose, capillary     Status: Abnormal   Collection Time: 04/30/14  7:36 AM  Result Value Ref Range   Glucose-Capillary 451 (H) 70 - 99 mg/dL   Comment 1 Notify RN     ABGS No results for input(s): PHART, PO2ART, TCO2, HCO3 in the last 72 hours.  Invalid input(s): PCO2 CULTURES No results found for this or any previous visit (from the past 240 hour(s)). Studies/Results: Ct Chest W Contrast  04/29/2014   CLINICAL DATA:  Cough with spitting up blood and sputum for 3 days.  EXAM: CT CHEST WITH CONTRAST  TECHNIQUE: Multidetector CT imaging of the chest was performed during intravenous contrast administration.  CONTRAST:  73mL OMNIPAQUE IOHEXOL 300 MG/ML  SOLN  COMPARISON:  06/12/2013  FINDINGS: Spiculated mass in the right apex measuring 2 x 2.7 cm. This is enlarging since the previous study. The previous cavitary focus has resolved or filled in over the interval. Appearance is worrisome for neoplasm. Consolidation in the right lower lobe posteriorly similar previous study. This is worrisome for a centrally obstructing mass lesion with postobstructive pneumonia. There is an enlarging thin-walled cavitary lesion in the posterior inferior right upper lung common now measuring 2.1 cm diameter, compared with 8 mm previously. Left lung appears clear and expanded. Emphysematous changes in both lungs. No pleural effusion. No pneumothorax. No significant lymphadenopathy in the chest.  Normal heart size. Normal caliber thoracic aorta. The great vessel origins are patent. The esophagus is diffusely dilated and partially fluid filled. The visualize stomach is also distended in this may be due to reflux although dysmotility or distal obstructing lesion are not excluded. Visualized upper abdominal organs are grossly unremarkable.  Degenerative changes in the spine. No destructive bone lesions appreciated.  IMPRESSION: Enlarging spiculated mass in the right lung apex. Enlarging cavitary mass in the inferior right upper lung. Persistent consolidation in the right lower lobe. Changes are most likely represent metastatic disease. Underlying diffuse emphysema.   Electronically Signed   By: Lucienne Capers M.D.   On: 04/29/2014 22:47   Dg Abd Acute W/chest  04/29/2014   CLINICAL DATA:  Bloody vomiting.  Sick for the past 3 days.  EXAM: ACUTE ABDOMEN SERIES (ABDOMEN 2 VIEW & CHEST 1 VIEW)  COMPARISON:  Chest CT 06/12/2013 and chest radiograph 02/28/2012  FINDINGS: There is a rounded opacity at the right lung base which is indeterminate. There were densities in this region on the previous imaging. Left lung is clear. Heart size is normal. No evidence for free air. Gas-filled loops of bowel in the abdomen appear to represent the stomach and colon. Moderate amount of stool in the pelvis.  IMPRESSION: Rounded opacity at the right lung base is indeterminate. This could represent chronic changes but cannot exclude a mass lesion in this area. This area could be better characterized with CT. These results were called by telephone at the time of interpretation on 04/29/2014 at 9:12 pm to  Dr. Broadus John ZAMMIT , who verbally acknowledged these results.  Nonspecific bowel gas pattern.   Electronically Signed   By: Markus Daft M.D.   On: 04/29/2014 21:12    Medications:  Prior to Admission:  Prescriptions prior to admission  Medication Sig Dispense Refill Last Dose  . ALPRAZolam (XANAX) 0.5 MG tablet Take 0.5 mg by mouth 6 (six) times daily. *May take up 7 times daily as needed for anxiety   04/29/2014 at Unknown time  . esomeprazole (NEXIUM) 20 MG capsule Take 20 mg by mouth daily as needed (for acid reflux). Patient states that she has to open capsule and mix with applesauce to take   Past Week at Unknown time  . insulin NPH (HUMULIN N) 100 UNIT/ML  injection Inject 10 Units into the skin 2 (two) times daily as needed. Use as directed based on sliding scale instructions   04/29/2014 at Unknown time  . promethazine (PHENERGAN) 25 MG tablet Take 25 mg by mouth 4 (four) times daily.    04/29/2014 at Unknown time  . aspirin 81 MG EC tablet Take 81 mg by mouth daily.     Taking  . diphenhydrAMINE (BENADRYL) 25 MG tablet Take 25 mg by mouth every 6 (six) hours as needed for allergies.    unknown  . HUMULIN R 100 UNIT/ML injection Inject 2 Units into the skin 2 (two) times daily as needed for high blood sugar.    unknown  . levothyroxine (SYNTHROID, LEVOTHROID) 50 MCG tablet Take 1 1/2 tabs daily     . nitroGLYCERIN (NITROSTAT) 0.4 MG SL tablet Place 0.4 mg under the tongue every 5 (five) minutes as needed.     unknown  . predniSONE (DELTASONE) 5 MG tablet Take 1 tablet by mouth as needed (for allergic reactions).    unknown  . TOPROL XL 25 MG 24 hr tablet TAKE (1) TABLET ONCE DAILY AS NEEDED. 30 each 6 Taking   Scheduled: . ALPRAZolam  0.5 mg Oral 6 X Daily  . [START ON 05/01/2014] Influenza vac split quadrivalent PF  0.5 mL Intramuscular Tomorrow-1000  . insulin aspart  1 Units Subcutaneous Q4H  . ipratropium-albuterol  3 mL Nebulization QID  . [START ON 05/01/2014] pneumococcal 23 valent vaccine  0.5 mL Intramuscular Tomorrow-1000  . promethazine  25 mg Oral 4 times per day   Continuous: . sodium chloride 75 mL/hr at 04/30/14 0309   UVO:[id number], diphenhydrAMINE, ondansetron **OR** ondansetron (ZOFRAN) IV, predniSONE  Assesment:she is known to have lung cancer. She has a long-standing anxiety disorder. She has permanent tracheostomy. 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  Principal Problem:   Postobstructive pneumonia Active Problems:   Malignant neoplasm of larynx   Chronic ischemic heart disease   S/P laryngectomy   Anxiety disorder   Chronic obstructive pulmonary disease   Lung mass   Cough with hemoptysis   Tracheostomy  dependence    Plan:I have sent for records from her hospitalizations in [facility]. Reportedly she was there because of psychosis    LOS: 1 day   Morenike Cuff L 04/30/2014, 8:59 AM

## 2014-04-30 NOTE — Progress Notes (Signed)
Inpatient Diabetes Program Recommendations  AACE/ADA: New Consensus Statement on Inpatient Glycemic Control (2013)  Target Ranges:  Prepandial:   less than 140 mg/dL      Peak postprandial:   less than 180 mg/dL (1-2 hours)      Critically ill patients:  140 - 180 mg/dL   Results for NIKISHA, FLEECE (MRN [id number]) as of 04/30/2014 06:49  Ref. Range 04/29/2014 19:47  Glucose Latest Range: 70-99 mg/dL 45 (L)  Results for GABI, MCFATE (MRN [id number]) as of 04/30/2014 06:49  Ref. Range 04/29/2014 19:52 04/29/2014 20:30 04/30/2014 00:21 04/30/2014 01:15 04/30/2014 04:08 04/30/2014 05:17  Glucose-Capillary Latest Range: 70-99 mg/dL 40 (LL) 185 (H) 63 (L) 227 (H) 367 (H) 470 (H)   Diabetes history: DM Outpatient Diabetes medications: NPH 10 units BID, Novolin R 2 units BID  Current orders for Inpatient glycemic control: CBGs ACHS  Inpatient Diabetes Program Recommendations Insulin - Basal: Noted initial CBG was 45 mg/dl but glucose is now up to 470 mg/dl.  Please consider ordering basal insulin. Correction (SSI): Please order Novolog correction scale ACHS. HgbA1C: Please order an A1C to evaluate glycemic control over the past 2-3 months.  Thanks, Barnie Alderman, RN, MSN, CCRN Diabetes Coordinator Inpatient Diabetes Program [phone] (Team Pager) [phone] ([place] office) [phone] [facility] office)

## 2014-04-30 NOTE — Plan of Care (Signed)
Problem: Phase I Progression Outcomes Goal: Pain controlled with appropriate interventions Outcome: Completed/Met Date Met:  04/30/14 Goal: OOB as tolerated unless otherwise ordered Outcome: Progressing Goal: Initial discharge plan identified Outcome: Progressing Goal: Voiding-avoid urinary catheter unless indicated Outcome: Completed/Met Date Met:  04/30/14 Goal: Hemodynamically stable Outcome: Completed/Met Date Met:  04/30/14 Goal: Other Phase I Outcomes/Goals Outcome: Completed/Met Date Met:  04/30/14  Problem: Phase II Progression Outcomes Goal: Progress activity as tolerated unless otherwise ordered Outcome: Progressing Goal: Vital signs remain stable Outcome: Progressing Goal: Obtain order to discontinue catheter if appropriate Outcome: Not Applicable Date Met:  42/35/36 Goal: Other Phase II Outcomes/Goals Outcome: Completed/Met Date Met:  04/30/14

## 2014-04-30 NOTE — Progress Notes (Signed)
Pt states that the only reason she came to the hospital tonight was because her house is infested with something that is causing her to break out and causing wounds to appear on her body. She was found to have pneumonia and is having some thick tan phlegm from her tracheostomy.

## 2014-04-30 NOTE — Progress Notes (Signed)
INITIAL NUTRITION ASSESSMENT  Pt meets criteria for severe MALNUTRITION in the context of chronic illness as evidenced by severe wasting (clavicle and acromion, patella, anterior thigh) and severe fat mass loss to upper arms and thorasic region.  DOCUMENTATION CODES Per approved criteria  -Severe malnutrition in the context of chronic illness   INTERVENTION: Ensure Complete po BID, each supplement provides 350 kcal and 13 grams of protein   MVI daily  NUTRITION DIAGNOSIS: Inadequate oral intake related to peumonia and altered mental status evidenced by severe wasting of muscle and fat mass .   Goal: Pt to meet >/= 90% of their estimated nutrition needs     Monitor: Po intake, labs and wt trends    Reason for Assessment: Malnutrition Screen Score =  5  63 y.o. female  Admitting Dx: Postobstructive pneumonia  ASSESSMENT: Pt lives alone. She presents with pneumonia and she has multiple skin lesions. 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 C/o being very tired today. She was able to eat 100% of breakfast and lunch. Feeds herself.  Reports that she drinks Boost at home. Question adequacy of nutrition intake given her severe depletion of muscle, fat mass in addition to concern for her mental status and her ability to purchase and prepare the nutrition foods to meet  her protein -energy requirements.     Nutrition Focused Physical Exam:  Subcutaneous Fat:  Orbital Region: mild depletion Upper Arm Region: severe depletion  Thoracic and Lumbar Region: severe depletion  Muscle:  Temple Region: mild wasting Clavicle Bone Region: severe wasting Clavicle and Acromion Bone Region: severe wasting Scapular Bone Region: severe wasting Dorsal Hand: moderate wasting Patellar Region: severe wasting Anterior Thigh Region: severe wasting Posterior Calf Region: moderate-severe wasting  Edema: none noted    Height: Ht Readings from Last 1 Encounters:  04/30/14  5' (1.524 m)    Weight: Wt Readings from Last 1 Encounters:  04/30/14 82 lb 0.2 oz (37.2 kg)    Ideal Body Weight: 100#  % Ideal Body Weight: 82%  Wt Readings from Last 10 Encounters:  04/30/14 82 lb 0.2 oz (37.2 kg)  04/23/11 91 lb (41.277 kg)  05/05/10 97 lb (43.999 kg)  09/16/09 97 lb (43.999 kg)  05/01/09 100 lb (45.36 kg)    Usual Body Weight: 85-90#  % Usual Body Weight: 90%  BMI:  Body mass index is 16.02 kg/(m^2). underweight  Estimated Nutritional Needs: Kcal: 1295-1480 (to prevent further weight loss) Protein: 55-62 gr Fluid: 1.3-1.5 liters daily  Skin:  she has multiple skin lesions and Stage II to sacrum  Diet Order: Diet Carb Modified  EDUCATION NEEDS: -No education needs identified at this time  No intake or output data in the 24 hours ending 04/30/14 0957  Last BM: 11/1   Labs:   Recent Labs Lab 04/29/14 1947 04/30/14 0601  NA 138 131*  K 3.4* 4.0  CL 93* 87*  CO2 31 30  BUN 39* 31*  CREATININE 1.12* 0.92  CALCIUM 9.8 9.4  GLUCOSE 45* 522*    CBG (last 3)   Recent Labs  04/30/14 0517 04/30/14 0655 04/30/14 0736  GLUCAP 470* 476* 451*    Scheduled Meds: . ALPRAZolam  0.5 mg Oral 6 X Daily  . [START ON 05/01/2014] Influenza vac split quadrivalent PF  0.5 mL Intramuscular Tomorrow-1000  . insulin aspart  1 Units Subcutaneous Q4H  . ipratropium-albuterol  3 mL Nebulization BID  . [START ON 05/01/2014] pneumococcal 23 valent vaccine  0.5  mL Intramuscular Tomorrow-1000  . promethazine  25 mg Oral 4 times per day    Continuous Infusions: . sodium chloride 75 mL/hr at 04/30/14 0309    Past Medical History  Diagnosis Date  . Atypical chest pain     likely musculoskeletal  . CAD (coronary artery disease)     single vessel. S/P Cypher stent of the right coronary artery October 2006 and patent by catheterization October 2007. Normal left ventricular function. Normal exercise Cardiolite study of 67% May 2008  . Chronic  obstructive pulmonary disease   . History of tobacco use     discontinued  . Dyslipidemia   . Insulin dependent diabetes mellitus   . 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 Anxiety disorder   . Hypothyroidism     Past Surgical History  Procedure Laterality Date  . Laryngectomy    . Permanent tracheostomy      Colman Cater MS,RD,CSG,LDN Office: #[phone] Pager: [phone]

## 2014-04-30 NOTE — H&P (Signed)
PCP:   Alonza Bogus, MD   Chief Complaint:  House infested  HPI: 63 yo female h/o lung cancer s/p radiation over 6 months ago, larynx cancer with permanent trach, CAD, anxiety comes into the ED mainly because her house is infested with something.  What was reported to me by the EDP was she came in for increased production from her trach.  However she denies this to me and says she came here because her house is infested with something and she is itching all over.  She has known lung cancer which she knows is still there, she completed radiation 6 months ago at [facility] and they have been "watching and waiting" since.  She follows with dr Luan Pulling.  There is no records here concerning her lung cancer.  She denies fevers.  No n/v.  She has had some bloody mucous from her trach.  A ct scan was done by  Dr zammit of her chest which shows a mass in the right apex with component of postobs pna.  Pt is refusing to take any antibiotics.  She says she is allergic to everything but cannot tell me specifically what she is allergic too.    Review of Systems:  Positive and negative as per HPI otherwise all other systems are negative  Past Medical History: Past Medical History  Diagnosis Date  . Atypical chest pain     likely musculoskeletal  . CAD (coronary artery disease)     single vessel. S/P Cypher stent of the right coronary artery October 2006 and patent by catheterization October 2007. Normal left ventricular function. Normal exercise Cardiolite study of 67% May 2008  . Chronic obstructive pulmonary disease   . History of tobacco use     discontinued  . Dyslipidemia   . Insulin dependent diabetes mellitus   . 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 Anxiety disorder   . Hypothyroidism    Past Surgical History  Procedure Laterality Date  . Laryngectomy    . Permanent tracheostomy      Medications: Prior to Admission medications   Medication Sig  Start Date End Date Taking? Authorizing Provider  ALPRAZolam Duanne Moron) 0.5 MG tablet Take 0.5 mg by mouth 6 (six) times daily. *May take up 7 times daily as needed for anxiety   Yes Historical Provider, MD  esomeprazole (NEXIUM) 20 MG capsule Take 20 mg by mouth daily as needed (for acid reflux). Patient states that she has to open capsule and mix with applesauce to take   Yes Historical Provider, MD  insulin NPH (HUMULIN N) 100 UNIT/ML injection Inject 10 Units into the skin 2 (two) times daily as needed. Use as directed based on sliding scale instructions   Yes Historical Provider, MD  promethazine (PHENERGAN) 25 MG tablet Take 25 mg by mouth 4 (four) times daily.    Yes Historical Provider, MD  aspirin 81 MG EC tablet Take 81 mg by mouth daily.      Historical Provider, MD  diphenhydrAMINE (BENADRYL) 25 MG tablet Take 25 mg by mouth every 6 (six) hours as needed for allergies.     Historical Provider, MD  HUMULIN R 100 UNIT/ML injection Inject 2 Units into the skin 2 (two) times daily as needed for high blood sugar.  03/24/11   Historical Provider, MD  levothyroxine (SYNTHROID, LEVOTHROID) 50 MCG tablet Take 1 1/2 tabs daily 04/30/11   Ezra Sites, MD  nitroGLYCERIN (NITROSTAT) 0.4 MG SL  tablet Place 0.4 mg under the tongue every 5 (five) minutes as needed.      Historical Provider, MD  predniSONE (DELTASONE) 5 MG tablet Take 1 tablet by mouth as needed (for allergic reactions).  03/24/11   Historical Provider, MD  TOPROL XL 25 MG 24 hr tablet TAKE (1) TABLET ONCE DAILY AS NEEDED. 03/24/11   Ezra Sites, MD    Allergies:   Allergies  Allergen Reactions  . Atorvastatin Other (See Comments)    Reaction is unknown  . Clopidogrel Bisulfate Other (See Comments)    Reaction unknown  . Oxycodone-Acetaminophen Nausea Only and Nausea And Vomiting  . Penicillins Other (See Comments)    Reaction is uknown  . Vancomycin Itching  . Codeine Rash    Social History:  reports that she quit smoking  about 7 years ago. Her smoking use included Cigarettes. She has a 45 pack-year smoking history. She has never used smokeless tobacco. Her alcohol and drug histories are not on file.  Family History: Family History  Problem Relation Age of Onset  . Diabetes      No FH  . Hypertension    . Coronary artery disease      Physical Exam: Filed Vitals:   04/29/14 1922 04/29/14 2101  BP: 106/64 114/60  Pulse:  79  Temp: 97.9 F (36.6 C)   TempSrc: Oral   Resp: 18 12  SpO2:  100%   General appearance: alert, cooperative, cachectic and no distress chronically ill appearing Head: Normocephalic, without obvious abnormality, atraumatic Eyes: negative Nose: Nares normal. Septum midline. Mucosa normal. No drainage or sinus tenderness. Neck: no JVD and trach Lungs: clear to auscultation bilaterally Heart: regular rate and rhythm, S1, S2 normal, no murmur, click, rub or gallop Abdomen: soft, non-tender; bowel sounds normal; no masses,  no organomegaly Extremities: extremities normal, atraumatic, no cyanosis or edema Pulses: 2+ and symmetric Skin: multiple excoriations an her arms and upper torso Neurologic: Grossly normal    Labs on Admission:   Recent Labs  04/29/14 1947  NA 138  K 3.4*  CL 93*  CO2 31  GLUCOSE 45*  BUN 39*  CREATININE 1.12*  CALCIUM 9.8    Recent Labs  04/29/14 1947  AST 21  ALT 11  ALKPHOS 139*  BILITOT 0.2*  PROT 7.9  ALBUMIN 3.4*    Recent Labs  04/29/14 1947  WBC 13.6*  NEUTROABS 12.1*  HGB 10.1*  HCT 30.0*  MCV 84.5  PLT 502*   Radiological Exams on Admission: Ct Chest W Contrast  04/29/2014   CLINICAL DATA:  Cough with spitting up blood and sputum for 3 days.  EXAM: CT CHEST WITH CONTRAST  TECHNIQUE: Multidetector CT imaging of the chest was performed during intravenous contrast administration.  CONTRAST:  32mL OMNIPAQUE IOHEXOL 300 MG/ML  SOLN  COMPARISON:  06/12/2013  FINDINGS: Spiculated mass in the right apex measuring 2 x 2.7 cm.  This is enlarging since the previous study. The previous cavitary focus has resolved or filled in over the interval. Appearance is worrisome for neoplasm. Consolidation in the right lower lobe posteriorly similar previous study. This is worrisome for a centrally obstructing mass lesion with postobstructive pneumonia. There is an enlarging thin-walled cavitary lesion in the posterior inferior right upper lung common now measuring 2.1 cm diameter, compared with 8 mm previously. Left lung appears clear and expanded. Emphysematous changes in both lungs. No pleural effusion. No pneumothorax. No significant lymphadenopathy in the chest.  Normal heart size. Normal  caliber thoracic aorta. The great vessel origins are patent. The esophagus is diffusely dilated and partially fluid filled. The visualize stomach is also distended in this may be due to reflux although dysmotility or distal obstructing lesion are not excluded. Visualized upper abdominal organs are grossly unremarkable. Degenerative changes in the spine. No destructive bone lesions appreciated.  IMPRESSION: Enlarging spiculated mass in the right lung apex. Enlarging cavitary mass in the inferior right upper lung. Persistent consolidation in the right lower lobe. Changes are most likely represent metastatic disease. Underlying diffuse emphysema.   Electronically Signed   By: Lucienne Capers M.D.   On: 04/29/2014 22:47   Dg Abd Acute W/chest  04/29/2014   CLINICAL DATA:  Bloody vomiting.  Sick for the past 3 days.  EXAM: ACUTE ABDOMEN SERIES (ABDOMEN 2 VIEW & CHEST 1 VIEW)  COMPARISON:  Chest CT 06/12/2013 and chest radiograph 02/28/2012  FINDINGS: There is a rounded opacity at the right lung base which is indeterminate. There were densities in this region on the previous imaging. Left lung is clear. Heart size is normal. No evidence for free air. Gas-filled loops of bowel in the abdomen appear to represent the stomach and colon. Moderate amount of stool in the  pelvis.  IMPRESSION: Rounded opacity at the right lung base is indeterminate. This could represent chronic changes but cannot exclude a mass lesion in this area. This area could be better characterized with CT. These results were called by telephone at the time of interpretation on 04/29/2014 at 9:12 pm to Dr. Milton Ferguson , who verbally acknowledged these results.  Nonspecific bowel gas pattern.   Electronically Signed   By: Markus Daft M.D.   On: 04/29/2014 21:12    Assessment/Plan  63 yo female with possible postobstructive pneumonia with h/o unknown lung cancer  Principal Problem:   Postobstructive pneumonia-  Pt refusing abx at this time and wishes to speak to dr Luan Pulling about this.  levaquin was ordered, but she does not want to take any abx.  Will hold until the am.    Active Problems:    Hypoglycemia-  Monitor sugars, feed patient., hold insulin.   Malignant neoplasm of larynx   Chronic ischemic heart disease   S/P laryngectomy   Anxiety disorder   Chronic obstructive pulmonary disease   Lung mass   Cough with hemoptysis   Tracheostomy dependence  pcp is dr Luan Pulling.  obs on medical bed.  Kameran Lallier A 04/30/2014, 12:07 AM

## 2014-04-30 NOTE — Progress Notes (Signed)
63 yo small F admitted with possible PNA & starting on Levaquin.  Estimated CrCl ~35-26ml/min.   Per manufacturer recommendations for CrCl 20-48ml/min, Levaquin dose should be adjusted to 500mg  x1 then 250mg  daily. Alternatively, could do 750mg  Q48h. Antibiotic doses adjusted for renal function.  Thanks, Netta Cedars, PharmD, BCPS 04/30/2014@2 :30 PM

## 2014-04-30 NOTE — Plan of Care (Signed)
CRITICAL VALUE ALERT  Critical value received:  CBG 597  Date of notification:  04/30/2014  Time of notification:  1700  Critical value read back:Yes.    Nurse who received alert:  Leroy Kennedy  MD notified (1st page):  Luan Pulling  Time of first page:  24  MD notified (2nd page):  Time of second page:  Responding MD:  Luan Pulling  Time MD responded:  1700   Orders received

## 2014-05-01 DIAGNOSIS — Z833 Family history of diabetes mellitus: Secondary | ICD-10-CM | POA: Diagnosis not present

## 2014-05-01 DIAGNOSIS — J440 Chronic obstructive pulmonary disease with acute lower respiratory infection: Secondary | ICD-10-CM | POA: Diagnosis present

## 2014-05-01 DIAGNOSIS — F419 Anxiety disorder, unspecified: Secondary | ICD-10-CM | POA: Diagnosis present

## 2014-05-01 DIAGNOSIS — Z930 Tracheostomy status: Secondary | ICD-10-CM | POA: Diagnosis not present

## 2014-05-01 DIAGNOSIS — Z8521 Personal history of malignant neoplasm of larynx: Secondary | ICD-10-CM | POA: Diagnosis not present

## 2014-05-01 DIAGNOSIS — Z7952 Long term (current) use of systemic steroids: Secondary | ICD-10-CM | POA: Diagnosis not present

## 2014-05-01 DIAGNOSIS — E43 Unspecified severe protein-calorie malnutrition: Secondary | ICD-10-CM | POA: Diagnosis present

## 2014-05-01 DIAGNOSIS — Z87891 Personal history of nicotine dependence: Secondary | ICD-10-CM | POA: Diagnosis not present

## 2014-05-01 DIAGNOSIS — F29 Unspecified psychosis not due to a substance or known physiological condition: Secondary | ICD-10-CM | POA: Diagnosis present

## 2014-05-01 DIAGNOSIS — Z8249 Family history of ischemic heart disease and other diseases of the circulatory system: Secondary | ICD-10-CM | POA: Diagnosis not present

## 2014-05-01 DIAGNOSIS — E785 Hyperlipidemia, unspecified: Secondary | ICD-10-CM | POA: Diagnosis present

## 2014-05-01 DIAGNOSIS — J189 Pneumonia, unspecified organism: Secondary | ICD-10-CM | POA: Diagnosis present

## 2014-05-01 DIAGNOSIS — E1165 Type 2 diabetes mellitus with hyperglycemia: Secondary | ICD-10-CM | POA: Diagnosis present

## 2014-05-01 DIAGNOSIS — Z794 Long term (current) use of insulin: Secondary | ICD-10-CM | POA: Diagnosis not present

## 2014-05-01 DIAGNOSIS — R05 Cough: Secondary | ICD-10-CM | POA: Diagnosis present

## 2014-05-01 DIAGNOSIS — Z923 Personal history of irradiation: Secondary | ICD-10-CM | POA: Diagnosis not present

## 2014-05-01 DIAGNOSIS — Z681 Body mass index (BMI) 19 or less, adult: Secondary | ICD-10-CM | POA: Diagnosis not present

## 2014-05-01 DIAGNOSIS — Z79899 Other long term (current) drug therapy: Secondary | ICD-10-CM | POA: Diagnosis not present

## 2014-05-01 DIAGNOSIS — Z7982 Long term (current) use of aspirin: Secondary | ICD-10-CM | POA: Diagnosis not present

## 2014-05-01 DIAGNOSIS — Z23 Encounter for immunization: Secondary | ICD-10-CM | POA: Diagnosis not present

## 2014-05-01 DIAGNOSIS — I2510 Atherosclerotic heart disease of native coronary artery without angina pectoris: Secondary | ICD-10-CM | POA: Diagnosis present

## 2014-05-01 DIAGNOSIS — Z85118 Personal history of other malignant neoplasm of bronchus and lung: Secondary | ICD-10-CM | POA: Diagnosis not present

## 2014-05-01 DIAGNOSIS — E039 Hypothyroidism, unspecified: Secondary | ICD-10-CM | POA: Diagnosis present

## 2014-05-01 DIAGNOSIS — F22 Delusional disorders: Secondary | ICD-10-CM | POA: Diagnosis present

## 2014-05-01 LAB — GLUCOSE, CAPILLARY
GLUCOSE-CAPILLARY: 465 mg/dL — AB (ref 70–99)
GLUCOSE-CAPILLARY: 330 mg/dL — AB (ref 70–99)
Glucose-Capillary: 174 mg/dL — ABNORMAL HIGH (ref 70–99)
Glucose-Capillary: 304 mg/dL — ABNORMAL HIGH (ref 70–99)
Glucose-Capillary: 339 mg/dL — ABNORMAL HIGH (ref 70–99)
Glucose-Capillary: 417 mg/dL — ABNORMAL HIGH (ref 70–99)

## 2014-05-01 MED ORDER — INSULIN DETEMIR 100 UNIT/ML ~~LOC~~ SOLN
5.0000 [IU] | Freq: Every day | SUBCUTANEOUS | Status: DC
  Filled 2014-05-01 (×2): qty 0.05

## 2014-05-01 MED ORDER — ALPRAZOLAM 0.5 MG PO TABS
0.5000 mg | ORAL_TABLET | Freq: Four times a day (QID) | ORAL | Status: DC | PRN
  Administered 2014-05-01 – 2014-05-06 (×16): 0.5 mg via ORAL
  Filled 2014-05-01 (×20): qty 1

## 2014-05-01 MED ORDER — PROMETHAZINE HCL 12.5 MG PO TABS
25.0000 mg | ORAL_TABLET | Freq: Four times a day (QID) | ORAL | Status: DC | PRN
  Filled 2014-05-01: qty 2

## 2014-05-01 NOTE — Clinical Social Work Note (Signed)
CSW spoke with Tamela Oddi, TTS who states that blood sugars must be <350 for 24 hours to pursue placement. MD and RN notified.   Benay Pike, [place]

## 2014-05-01 NOTE — Clinical Social Work Note (Signed)
CSW called TTS and notified them that pt is stable for transfer to inpatient psych per MD.   Benay Pike, [place]

## 2014-05-01 NOTE — Progress Notes (Addendum)
Inpatient Diabetes Program Recommendations  AACE/ADA: New Consensus Statement on Inpatient Glycemic Control (2013)  Target Ranges:  Prepandial:   less than 140 mg/dL      Peak postprandial:   less than 180 mg/dL (1-2 hours)      Critically ill patients:  140 - 180 mg/dL   Results for [name], [name] (MRN [id number]) as of 05/01/2014 08:32  Ref. Range 04/30/2014 17:22 04/30/2014 18:01 04/30/2014 18:56 04/30/2014 20:06 04/30/2014 23:11 04/30/2014 23:41 05/01/2014 00:09 05/01/2014 04:24 05/01/2014 07:28  Glucose-Capillary Latest Range: 70-99 mg/dL >600 (HH) 519 (H) 476 (H) 280 (H) 75 137 (H) 174 (H) 465 (H) 417 (H)   Diabetes history: DM Outpatient Diabetes medications: NPH 10 units BID, Novolin R 2 units BID  Current orders for Inpatient glycemic control: Novolog 1-8 units Q4H Inpatient Diabetes Program Recommendations Insulin - Basal: Please consider ordering basal insulin. Recommend starting with Levemir 10 units daily (to start now). HgbA1C: Please order an A1C to evaluate glycemic control over the past 2-3 months.  Thanks, Barnie Alderman, RN, MSN, CCRN Diabetes Coordinator Inpatient Diabetes Program [phone] (Team Pager) [phone] ([place] office) [phone] [facility] office)

## 2014-05-01 NOTE — Progress Notes (Addendum)
Subjective: She is overall about the same. She continues to have wild fluctuations in her blood sugar which is her baseline. Unfortunately she has been managing her sugar on her own for some time and instead of eating something like peanut butter and crackers when her sugar is low she tends to eat a candy bar which of course makes her sugar much higher. The help from [facility] psychiatry is noted and appreciated. She did not have metastatic disease to brain on her brain scan  Objective: Vital signs in last 24 hours: Temp:  [98.9 F (37.2 C)-99 F (37.2 C)] 99 F (37.2 C) (11/03 0442) Pulse Rate:  [90-97] 97 (11/03 0442) Resp:  [17-20] 20 (11/03 0442) BP: (113-138)/(57-66) 138/65 mmHg (11/03 0442) SpO2:  [96 %-100 %] 97 % (11/03 0700) Weight change:  Last BM Date: 04/29/14  Intake/Output from previous day: 11/02 0701 - 11/03 0700 In: 720 [P.O.:720] Out: -   PHYSICAL EXAM General appearance: alert and no distress Resp: rhonchi bilaterally Cardio: regular rate and rhythm, S1, S2 normal, no murmur, click, rub or gallop GI: soft, non-tender; bowel sounds normal; no masses,  no organomegaly Extremities: extremities normal, atraumatic, no cyanosis or edema  Lab Results:  Results for orders placed or performed during the hospital encounter of 04/29/14 (from the past 48 hour(s))  CBC with Differential     Status: Abnormal   Collection Time: 04/29/14  7:47 PM  Result Value Ref Range   WBC 13.6 (H) 4.0 - 10.5 K/uL   RBC 3.55 (L) 3.87 - 5.11 MIL/uL   Hemoglobin 10.1 (L) 12.0 - 15.0 g/dL   HCT 30.0 (L) 36.0 - 46.0 %   MCV 84.5 78.0 - 100.0 fL   MCH 28.5 26.0 - 34.0 pg   MCHC 33.7 30.0 - 36.0 g/dL   RDW 16.2 (H) 11.5 - 15.5 %   Platelets 502 (H) 150 - 400 K/uL   Neutrophils Relative % 89 (H) 43 - 77 %   Neutro Abs 12.1 (H) 1.7 - 7.7 K/uL   Lymphocytes Relative 5 (L) 12 - 46 %   Lymphs Abs 0.7 0.7 - 4.0 K/uL   Monocytes Relative 6 3 - 12 %   Monocytes Absolute 0.8 0.1 - 1.0 K/uL    Eosinophils Relative 0 0 - 5 %   Eosinophils Absolute 0.0 0.0 - 0.7 K/uL   Basophils Relative 0 0 - 1 %   Basophils Absolute 0.0 0.0 - 0.1 K/uL  Comprehensive metabolic panel     Status: Abnormal   Collection Time: 04/29/14  7:47 PM  Result Value Ref Range   Sodium 138 137 - 147 mEq/L   Potassium 3.4 (L) 3.7 - 5.3 mEq/L   Chloride 93 (L) 96 - 112 mEq/L   CO2 31 19 - 32 mEq/L   Glucose, Bld 45 (L) 70 - 99 mg/dL   BUN 39 (H) 6 - 23 mg/dL   Creatinine, Ser 1.12 (H) 0.50 - 1.10 mg/dL   Calcium 9.8 8.4 - 10.5 mg/dL   Total Protein 7.9 6.0 - 8.3 g/dL   Albumin 3.4 (L) 3.5 - 5.2 g/dL   AST 21 0 - 37 U/L   ALT 11 0 - 35 U/L   Alkaline Phosphatase 139 (H) 39 - 117 U/L   Total Bilirubin 0.2 (L) 0.3 - 1.2 mg/dL   GFR calc non Af Amer 51 (L) >90 mL/min   GFR calc Af Amer 59 (L) >90 mL/min    Comment: (NOTE) The eGFR has been calculated  using the CKD EPI equation. This calculation has not been validated in all clinical situations. eGFR's persistently <90 mL/min signify possible Chronic Kidney Disease.    Anion gap 14 5 - 15  CBG monitoring, ED     Status: Abnormal   Collection Time: 04/29/14  7:52 PM  Result Value Ref Range   Glucose-Capillary 40 (LL) 70 - 99 mg/dL  CBG monitoring, ED     Status: Abnormal   Collection Time: 04/29/14  8:30 PM  Result Value Ref Range   Glucose-Capillary 185 (H) 70 - 99 mg/dL  Urinalysis, Routine w reflex microscopic     Status: Abnormal   Collection Time: 04/29/14  9:10 PM  Result Value Ref Range   Color, Urine YELLOW YELLOW   APPearance CLOUDY (A) CLEAR   Specific Gravity, Urine 1.025 1.005 - 1.030   pH 5.5 5.0 - 8.0   Glucose, UA 500 (A) NEGATIVE mg/dL   Hgb urine dipstick SMALL (A) NEGATIVE   Bilirubin Urine SMALL (A) NEGATIVE   Ketones, ur NEGATIVE NEGATIVE mg/dL   Protein, ur NEGATIVE NEGATIVE mg/dL   Urobilinogen, UA 0.2 0.0 - 1.0 mg/dL   Nitrite NEGATIVE NEGATIVE   Leukocytes, UA SMALL (A) NEGATIVE  Urine microscopic-add on     Status:  Abnormal   Collection Time: 04/29/14  9:10 PM  Result Value Ref Range   Squamous Epithelial / LPF FEW (A) RARE   WBC, UA 7-10 <3 WBC/hpf   RBC / HPF 3-6 <3 RBC/hpf   Bacteria, UA MANY (A) RARE  CBG monitoring, ED     Status: Abnormal   Collection Time: 04/30/14 12:21 AM  Result Value Ref Range   Glucose-Capillary 63 (L) 70 - 99 mg/dL  Glucose, capillary     Status: Abnormal   Collection Time: 04/30/14  1:15 AM  Result Value Ref Range   Glucose-Capillary 227 (H) 70 - 99 mg/dL   Comment 1 Notify RN   Glucose, capillary     Status: Abnormal   Collection Time: 04/30/14  4:08 AM  Result Value Ref Range   Glucose-Capillary 367 (H) 70 - 99 mg/dL   Comment 1 Notify RN   Glucose, capillary     Status: Abnormal   Collection Time: 04/30/14  5:17 AM  Result Value Ref Range   Glucose-Capillary 470 (H) 70 - 99 mg/dL  Basic metabolic panel     Status: Abnormal   Collection Time: 04/30/14  6:01 AM  Result Value Ref Range   Sodium 131 (L) 137 - 147 mEq/L    Comment: DELTA CHECK NOTED   Potassium 4.0 3.7 - 5.3 mEq/L   Chloride 87 (L) 96 - 112 mEq/L   CO2 30 19 - 32 mEq/L   Glucose, Bld 522 (H) 70 - 99 mg/dL   BUN 31 (H) 6 - 23 mg/dL   Creatinine, Ser 0.92 0.50 - 1.10 mg/dL   Calcium 9.4 8.4 - 10.5 mg/dL   GFR calc non Af Amer 64 (L) >90 mL/min   GFR calc Af Amer 75 (L) >90 mL/min    Comment: (NOTE) The eGFR has been calculated using the CKD EPI equation. This calculation has not been validated in all clinical situations. eGFR's persistently <90 mL/min signify possible Chronic Kidney Disease.    Anion gap 14 5 - 15  CBC     Status: Abnormal   Collection Time: 04/30/14  6:01 AM  Result Value Ref Range   WBC 16.0 (H) 4.0 - 10.5 K/uL   RBC 3.18 (L)  3.87 - 5.11 MIL/uL   Hemoglobin 9.0 (L) 12.0 - 15.0 g/dL   HCT 27.4 (L) 36.0 - 46.0 %   MCV 86.2 78.0 - 100.0 fL   MCH 28.3 26.0 - 34.0 pg   MCHC 32.8 30.0 - 36.0 g/dL   RDW 16.6 (H) 11.5 - 15.5 %   Platelets 464 (H) 150 - 400 K/uL   Glucose, capillary     Status: Abnormal   Collection Time: 04/30/14  6:55 AM  Result Value Ref Range   Glucose-Capillary 476 (H) 70 - 99 mg/dL  Glucose, capillary     Status: Abnormal   Collection Time: 04/30/14  7:36 AM  Result Value Ref Range   Glucose-Capillary 451 (H) 70 - 99 mg/dL   Comment 1 Notify RN   Glucose, capillary     Status: Abnormal   Collection Time: 04/30/14 11:58 AM  Result Value Ref Range   Glucose-Capillary 367 (H) 70 - 99 mg/dL   Comment 1 Notify RN   Glucose, capillary     Status: Abnormal   Collection Time: 04/30/14  4:45 PM  Result Value Ref Range   Glucose-Capillary 597 (HH) 70 - 99 mg/dL   Comment 1 Notify RN   Glucose, capillary     Status: Abnormal   Collection Time: 04/30/14  5:22 PM  Result Value Ref Range   Glucose-Capillary >600 (HH) 70 - 99 mg/dL   Comment 1 Notify RN   Glucose, capillary     Status: Abnormal   Collection Time: 04/30/14  6:01 PM  Result Value Ref Range   Glucose-Capillary 519 (H) 70 - 99 mg/dL   Comment 1 Notify RN   Glucose, capillary     Status: Abnormal   Collection Time: 04/30/14  6:56 PM  Result Value Ref Range   Glucose-Capillary 476 (H) 70 - 99 mg/dL   Comment 1 Notify RN   Glucose, capillary     Status: Abnormal   Collection Time: 04/30/14  8:06 PM  Result Value Ref Range   Glucose-Capillary 280 (H) 70 - 99 mg/dL  Glucose, capillary     Status: None   Collection Time: 04/30/14 11:11 PM  Result Value Ref Range   Glucose-Capillary 75 70 - 99 mg/dL  Glucose, capillary     Status: Abnormal   Collection Time: 04/30/14 11:41 PM  Result Value Ref Range   Glucose-Capillary 137 (H) 70 - 99 mg/dL  Glucose, capillary     Status: Abnormal   Collection Time: 05/01/14 12:09 AM  Result Value Ref Range   Glucose-Capillary 174 (H) 70 - 99 mg/dL  Glucose, capillary     Status: Abnormal   Collection Time: 05/01/14  4:24 AM  Result Value Ref Range   Glucose-Capillary 465 (H) 70 - 99 mg/dL  Glucose, capillary     Status:  Abnormal   Collection Time: 05/01/14  7:28 AM  Result Value Ref Range   Glucose-Capillary 417 (H) 70 - 99 mg/dL   Comment 1 Documented in Chart    Comment 2 Notify RN     ABGS No results for input(s): PHART, PO2ART, TCO2, HCO3 in the last 72 hours.  Invalid input(s): PCO2 CULTURES No results found for this or any previous visit (from the past 240 hour(s)). Studies/Results: Ct Head W Wo Contrast  04/30/2014   CLINICAL DATA:  Personal history of laryngeal cancer. Paranoid ideation.  EXAM: CT HEAD WITHOUT AND WITH CONTRAST  TECHNIQUE: Contiguous axial images were obtained from the base of the skull through the  vertex without and with intravenous contrast  CONTRAST:  69m OMNIPAQUE IOHEXOL 300 MG/ML  SOLN  COMPARISON:  05/18/2006  FINDINGS: The brain has normal appearance without evidence of old or acute infarction, mass lesion, hemorrhage, hydrocephalus or extra-axial collection. After contrast administration, no abnormal enhancement occurs. The calvarium is unremarkable. Visualized sinuses, middle ears and mastoids are clear. There is atherosclerotic calcification of the major vessels at the base of the brain.  IMPRESSION: No acute or focal finding. Atherosclerosis of the vessels at the base of the brain.   Electronically Signed   By: MNelson ChimesM.D.   On: 04/30/2014 17:28   Ct Chest W Contrast  04/29/2014   CLINICAL DATA:  Cough with spitting up blood and sputum for 3 days.  EXAM: CT CHEST WITH CONTRAST  TECHNIQUE: Multidetector CT imaging of the chest was performed during intravenous contrast administration.  CONTRAST:  829mOMNIPAQUE IOHEXOL 300 MG/ML  SOLN  COMPARISON:  06/12/2013  FINDINGS: Spiculated mass in the right apex measuring 2 x 2.7 cm. This is enlarging since the previous study. The previous cavitary focus has resolved or filled in over the interval. Appearance is worrisome for neoplasm. Consolidation in the right lower lobe posteriorly similar previous study. This is worrisome for  a centrally obstructing mass lesion with postobstructive pneumonia. There is an enlarging thin-walled cavitary lesion in the posterior inferior right upper lung common now measuring 2.1 cm diameter, compared with 8 mm previously. Left lung appears clear and expanded. Emphysematous changes in both lungs. No pleural effusion. No pneumothorax. No significant lymphadenopathy in the chest.  Normal heart size. Normal caliber thoracic aorta. The great vessel origins are patent. The esophagus is diffusely dilated and partially fluid filled. The visualize stomach is also distended in this may be due to reflux although dysmotility or distal obstructing lesion are not excluded. Visualized upper abdominal organs are grossly unremarkable. Degenerative changes in the spine. No destructive bone lesions appreciated.  IMPRESSION: Enlarging spiculated mass in the right lung apex. Enlarging cavitary mass in the inferior right upper lung. Persistent consolidation in the right lower lobe. Changes are most likely represent metastatic disease. Underlying diffuse emphysema.   Electronically Signed   By: WiLucienne Capers.D.   On: 04/29/2014 22:47   Dg Abd Acute W/chest  04/29/2014   CLINICAL DATA:  Bloody vomiting.  Sick for the past 3 days.  EXAM: ACUTE ABDOMEN SERIES (ABDOMEN 2 VIEW & CHEST 1 VIEW)  COMPARISON:  Chest CT 06/12/2013 and chest radiograph 02/28/2012  FINDINGS: There is a rounded opacity at the right lung base which is indeterminate. There were densities in this region on the previous imaging. Left lung is clear. Heart size is normal. No evidence for free air. Gas-filled loops of bowel in the abdomen appear to represent the stomach and colon. Moderate amount of stool in the pelvis.  IMPRESSION: Rounded opacity at the right lung base is indeterminate. This could represent chronic changes but cannot exclude a mass lesion in this area. This area could be better characterized with CT. These results were called by telephone  at the time of interpretation on 04/29/2014 at 9:12 pm to Dr. JOMilton Ferguson who verbally acknowledged these results.  Nonspecific bowel gas pattern.   Electronically Signed   By: AdMarkus Daft.D.   On: 04/29/2014 21:12    Medications:  Prior to Admission:  Prescriptions prior to admission  Medication Sig Dispense Refill Last Dose  . ALPRAZolam (XANAX) 0.5 MG tablet Take 0.5 mg by  mouth 6 (six) times daily. *May take up 7 times daily as needed for anxiety   04/29/2014 at Unknown time  . esomeprazole (NEXIUM) 20 MG capsule Take 20 mg by mouth daily as needed (for acid reflux). Patient states that she has to open capsule and mix with applesauce to take   Past Week at Unknown time  . insulin NPH (HUMULIN N) 100 UNIT/ML injection Inject 10 Units into the skin 2 (two) times daily as needed. Use as directed based on sliding scale instructions   04/29/2014 at Unknown time  . promethazine (PHENERGAN) 25 MG tablet Take 25 mg by mouth 4 (four) times daily.    04/29/2014 at Unknown time  . aspirin 81 MG EC tablet Take 81 mg by mouth daily.     Taking  . diphenhydrAMINE (BENADRYL) 25 MG tablet Take 25 mg by mouth every 6 (six) hours as needed for allergies.    unknown  . HUMULIN R 100 UNIT/ML injection Inject 2 Units into the skin 2 (two) times daily as needed for high blood sugar.    unknown  . levothyroxine (SYNTHROID, LEVOTHROID) 50 MCG tablet Take 1 1/2 tabs daily     . nitroGLYCERIN (NITROSTAT) 0.4 MG SL tablet Place 0.4 mg under the tongue every 5 (five) minutes as needed.     unknown  . predniSONE (DELTASONE) 5 MG tablet Take 1 tablet by mouth as needed (for allergic reactions).    unknown  . TOPROL XL 25 MG 24 hr tablet TAKE (1) TABLET ONCE DAILY AS NEEDED. 30 each 6 Taking   Scheduled: . ALPRAZolam  0.5 mg Oral 6 X Daily  . feeding supplement (ENSURE COMPLETE)  237 mL Oral BID BM  . Influenza vac split quadrivalent PF  0.5 mL Intramuscular Tomorrow-1000  . insulin aspart  1-8 Units Subcutaneous Q4H   . levofloxacin (LEVAQUIN) IV  250 mg Intravenous Q24H  . multivitamin with minerals  1 tablet Oral Daily  . pantoprazole  40 mg Oral BID  . pneumococcal 23 valent vaccine  0.5 mL Intramuscular Tomorrow-1000  . promethazine  25 mg Oral 4 times per day   Continuous:  GEX:[id number], diphenhydrAMINE, ipratropium-albuterol, ondansetron **OR** ondansetron (ZOFRAN) IV  Assesment:she was admitted with obstructive pneumonia and is on treatment for that. 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 She also has primary lung cancer and is not going under any treatment for that now. She has psychosis and is felt to need inpatient treatment. She has severe protein calorie malnutrition related to her malignancy. She has diabetes and her blood sugar in the time that I have known her has never been controlled and has always had very wide fluctuations partially because she is unwilling to really follow any instructions  Principal Problem:   Postobstructive pneumonia Active Problems:   Malignant neoplasm of larynx   Chronic ischemic heart disease   S/P laryngectomy   Anxiety disorder   Chronic obstructive pulmonary disease   Lung mass   Cough with hemoptysis   Tracheostomy dependence   Protein-calorie malnutrition, severe    Plan:continue current treatments. I will add a small dose of basal insulin.she is medically stable for transfer to a combined medical psychiatric unit.    LOS: 2 days   Aaren Krog L 05/01/2014, 8:49 AM

## 2014-05-02 DIAGNOSIS — J189 Pneumonia, unspecified organism: Secondary | ICD-10-CM | POA: Diagnosis not present

## 2014-05-02 DIAGNOSIS — F22 Delusional disorders: Secondary | ICD-10-CM

## 2014-05-02 LAB — GLUCOSE, CAPILLARY
GLUCOSE-CAPILLARY: 408 mg/dL — AB (ref 70–99)
GLUCOSE-CAPILLARY: 494 mg/dL — AB (ref 70–99)
GLUCOSE-CAPILLARY: 336 mg/dL — AB (ref 70–99)
GLUCOSE-CAPILLARY: 158 mg/dL — AB (ref 70–99)
Glucose-Capillary: 159 mg/dL — ABNORMAL HIGH (ref 70–99)
Glucose-Capillary: 291 mg/dL — ABNORMAL HIGH (ref 70–99)
Glucose-Capillary: 323 mg/dL — ABNORMAL HIGH (ref 70–99)
Glucose-Capillary: 333 mg/dL — ABNORMAL HIGH (ref 70–99)
Glucose-Capillary: 474 mg/dL — ABNORMAL HIGH (ref 70–99)
Glucose-Capillary: 519 mg/dL — ABNORMAL HIGH (ref 70–99)
Glucose-Capillary: 562 mg/dL (ref 70–99)

## 2014-05-02 MED ORDER — INSULIN ASPART 100 UNIT/ML ~~LOC~~ SOLN
8.0000 [IU] | Freq: Once | SUBCUTANEOUS | Status: AC
  Administered 2014-05-02: 8 [IU] via SUBCUTANEOUS

## 2014-05-02 MED ORDER — INSULIN ASPART 100 UNIT/ML ~~LOC~~ SOLN
1.0000 [IU] | SUBCUTANEOUS | Status: DC
  Administered 2014-05-02: 5 [IU] via SUBCUTANEOUS
  Administered 2014-05-02: 6 [IU] via SUBCUTANEOUS
  Administered 2014-05-03: 4 [IU] via SUBCUTANEOUS
  Administered 2014-05-03: 6 [IU] via SUBCUTANEOUS
  Administered 2014-05-03: 4 [IU] via SUBCUTANEOUS
  Administered 2014-05-03: 8 [IU] via SUBCUTANEOUS
  Administered 2014-05-04: 6 [IU] via SUBCUTANEOUS
  Administered 2014-05-04: 8 [IU] via SUBCUTANEOUS
  Administered 2014-05-04: 5 [IU] via SUBCUTANEOUS
  Administered 2014-05-04 – 2014-05-05 (×3): 8 [IU] via SUBCUTANEOUS
  Administered 2014-05-05: 4 [IU] via SUBCUTANEOUS
  Administered 2014-05-06 (×2): 3 [IU] via SUBCUTANEOUS
  Administered 2014-05-06: 5 [IU] via SUBCUTANEOUS

## 2014-05-02 MED ORDER — INSULIN ASPART 100 UNIT/ML ~~LOC~~ SOLN
1.0000 [IU] | Freq: Once | SUBCUTANEOUS | Status: AC
  Administered 2014-05-02: 8 [IU] via SUBCUTANEOUS

## 2014-05-02 MED ORDER — INSULIN DETEMIR 100 UNIT/ML ~~LOC~~ SOLN
8.0000 [IU] | Freq: Every day | SUBCUTANEOUS | Status: DC
  Administered 2014-05-02 – 2014-05-04 (×3): 8 [IU] via SUBCUTANEOUS
  Filled 2014-05-02 (×6): qty 0.08

## 2014-05-02 MED ORDER — ASPIRIN 325 MG PO TABS
325.0000 mg | ORAL_TABLET | Freq: Four times a day (QID) | ORAL | Status: DC | PRN
  Administered 2014-05-02 – 2014-05-05 (×3): 325 mg via ORAL
  Filled 2014-05-02 (×3): qty 1

## 2014-05-02 NOTE — Plan of Care (Signed)
Problem: Phase I Progression Outcomes Goal: OOB as tolerated unless otherwise ordered Outcome: Completed/Met Date Met:  05/02/14 Goal: Initial discharge plan identified Outcome: Completed/Met Date Met:  05/02/14     

## 2014-05-02 NOTE — Progress Notes (Signed)
Pt to be reassessed by Catalina Pizza, NP.  Charlene Brooke, MSW  Social Worker [phone]

## 2014-05-02 NOTE — Progress Notes (Signed)
Subjective: Today she says that she will take insulin if ordered. We are trying to get her blood sugar down enough so that she could be transferred to a combination psychiatric medical unit for help with her psychosis.  Objective: Vital signs in last 24 hours: Temp:  [98.8 F (37.1 C)-99.6 F (37.6 C)] 99.6 F (37.6 C) (11/04 0525) Pulse Rate:  [88-92] 88 (11/04 0525) Resp:  [18] 18 (11/04 0525) BP: (93-111)/(42-49) 108/49 mmHg (11/04 0525) SpO2:  [96 %-100 %] 96 % (11/04 0525) Weight change:  Last BM Date: 05/01/14  Intake/Output from previous day: 11/03 0701 - 11/04 0700 In: 360 [P.O.:360] Out: 410 [Urine:410]  PHYSICAL EXAM General appearance: alert and no distress Resp: rhonchi bilaterally Cardio: regular rate and rhythm, S1, S2 normal, no murmur, click, rub or gallop GI: soft, non-tender; bowel sounds normal; no masses,  no organomegaly Extremities: extremities normal, atraumatic, no cyanosis or edema  Lab Results:  Results for orders placed or performed during the hospital encounter of 04/29/14 (from the past 48 hour(s))  Glucose, capillary     Status: Abnormal   Collection Time: 04/30/14 11:58 AM  Result Value Ref Range   Glucose-Capillary 367 (H) 70 - 99 mg/dL   Comment 1 Notify RN   Glucose, capillary     Status: Abnormal   Collection Time: 04/30/14  4:45 PM  Result Value Ref Range   Glucose-Capillary 597 (HH) 70 - 99 mg/dL   Comment 1 Notify RN   Glucose, capillary     Status: Abnormal   Collection Time: 04/30/14  5:22 PM  Result Value Ref Range   Glucose-Capillary >600 (HH) 70 - 99 mg/dL   Comment 1 Notify RN   Glucose, capillary     Status: Abnormal   Collection Time: 04/30/14  6:01 PM  Result Value Ref Range   Glucose-Capillary 519 (H) 70 - 99 mg/dL   Comment 1 Notify RN   Glucose, capillary     Status: Abnormal   Collection Time: 04/30/14  6:56 PM  Result Value Ref Range   Glucose-Capillary 476 (H) 70 - 99 mg/dL   Comment 1 Notify RN   Glucose,  capillary     Status: Abnormal   Collection Time: 04/30/14  8:06 PM  Result Value Ref Range   Glucose-Capillary 280 (H) 70 - 99 mg/dL  Glucose, capillary     Status: None   Collection Time: 04/30/14 11:11 PM  Result Value Ref Range   Glucose-Capillary 75 70 - 99 mg/dL  Glucose, capillary     Status: Abnormal   Collection Time: 04/30/14 11:41 PM  Result Value Ref Range   Glucose-Capillary 137 (H) 70 - 99 mg/dL  Glucose, capillary     Status: Abnormal   Collection Time: 05/01/14 12:09 AM  Result Value Ref Range   Glucose-Capillary 174 (H) 70 - 99 mg/dL  Glucose, capillary     Status: Abnormal   Collection Time: 05/01/14  4:24 AM  Result Value Ref Range   Glucose-Capillary 465 (H) 70 - 99 mg/dL  Glucose, capillary     Status: Abnormal   Collection Time: 05/01/14  7:28 AM  Result Value Ref Range   Glucose-Capillary 417 (H) 70 - 99 mg/dL   Comment 1 Documented in Chart    Comment 2 Notify RN   Glucose, capillary     Status: Abnormal   Collection Time: 05/01/14 11:02 AM  Result Value Ref Range   Glucose-Capillary 330 (H) 70 - 99 mg/dL   Comment 1 Documented  in Chart    Comment 2 Notify RN   Glucose, capillary     Status: Abnormal   Collection Time: 05/01/14  4:25 PM  Result Value Ref Range   Glucose-Capillary 339 (H) 70 - 99 mg/dL   Comment 1 Notify RN    Comment 2 Documented in Chart   Glucose, capillary     Status: Abnormal   Collection Time: 05/01/14  8:04 PM  Result Value Ref Range   Glucose-Capillary 304 (H) 70 - 99 mg/dL   Comment 1 Notify RN   Glucose, capillary     Status: Abnormal   Collection Time: 05/02/14 12:04 AM  Result Value Ref Range   Glucose-Capillary 519 (H) 70 - 99 mg/dL   Comment 1 Notify RN   Glucose, capillary     Status: Abnormal   Collection Time: 05/02/14  1:27 AM  Result Value Ref Range   Glucose-Capillary 408 (H) 70 - 99 mg/dL   Comment 1 Notify RN   Glucose, capillary     Status: Abnormal   Collection Time: 05/02/14  4:02 AM  Result Value  Ref Range   Glucose-Capillary 333 (H) 70 - 99 mg/dL   Comment 1 Notify RN   Glucose, capillary     Status: Abnormal   Collection Time: 05/02/14  7:35 AM  Result Value Ref Range   Glucose-Capillary 474 (H) 70 - 99 mg/dL   Comment 1 Notify RN     ABGS No results for input(s): PHART, PO2ART, TCO2, HCO3 in the last 72 hours.  Invalid input(s): PCO2 CULTURES No results found for this or any previous visit (from the past 240 hour(s)). Studies/Results: Ct Head W Wo Contrast  04/30/2014   CLINICAL DATA:  Personal history of laryngeal cancer. Paranoid ideation.  EXAM: CT HEAD WITHOUT AND WITH CONTRAST  TECHNIQUE: Contiguous axial images were obtained from the base of the skull through the vertex without and with intravenous contrast  CONTRAST:  17mL OMNIPAQUE IOHEXOL 300 MG/ML  SOLN  COMPARISON:  05/18/2006  FINDINGS: The brain has normal appearance without evidence of old or acute infarction, mass lesion, hemorrhage, hydrocephalus or extra-axial collection. After contrast administration, no abnormal enhancement occurs. The calvarium is unremarkable. Visualized sinuses, middle ears and mastoids are clear. There is atherosclerotic calcification of the major vessels at the base of the brain.  IMPRESSION: No acute or focal finding. Atherosclerosis of the vessels at the base of the brain.   Electronically Signed   By: Nelson Chimes M.D.   On: 04/30/2014 17:28    Medications:  Prior to Admission:  Prescriptions prior to admission  Medication Sig Dispense Refill Last Dose  . ALPRAZolam (XANAX) 0.5 MG tablet Take 0.5 mg by mouth 6 (six) times daily. *May take up 7 times daily as needed for anxiety   04/29/2014 at Unknown time  . esomeprazole (NEXIUM) 20 MG capsule Take 20 mg by mouth daily as needed (for acid reflux). Patient states that she has to open capsule and mix with applesauce to take   Past Week at Unknown time  . insulin NPH (HUMULIN N) 100 UNIT/ML injection Inject 10 Units into the skin 2 (two)  times daily as needed. Use as directed based on sliding scale instructions   04/29/2014 at Unknown time  . promethazine (PHENERGAN) 25 MG tablet Take 25 mg by mouth 4 (four) times daily.    04/29/2014 at Unknown time  . aspirin 81 MG EC tablet Take 81 mg by mouth daily.     Taking  .  diphenhydrAMINE (BENADRYL) 25 MG tablet Take 25 mg by mouth every 6 (six) hours as needed for allergies.    unknown  . HUMULIN R 100 UNIT/ML injection Inject 2 Units into the skin 2 (two) times daily as needed for high blood sugar.    unknown  . levothyroxine (SYNTHROID, LEVOTHROID) 50 MCG tablet Take 1 1/2 tabs daily     . nitroGLYCERIN (NITROSTAT) 0.4 MG SL tablet Place 0.4 mg under the tongue every 5 (five) minutes as needed.     unknown  . predniSONE (DELTASONE) 5 MG tablet Take 1 tablet by mouth as needed (for allergic reactions).    unknown  . TOPROL XL 25 MG 24 hr tablet TAKE (1) TABLET ONCE DAILY AS NEEDED. 30 each 6 Taking   Scheduled: . feeding supplement (ENSURE COMPLETE)  237 mL Oral BID BM  . Influenza vac split quadrivalent PF  0.5 mL Intramuscular Tomorrow-1000  . insulin aspart  1-8 Units Subcutaneous Q4H  . insulin aspart  1-8 Units Subcutaneous Once  . insulin detemir  5 Units Subcutaneous Daily  . levofloxacin (LEVAQUIN) IV  250 mg Intravenous Q24H  . multivitamin with minerals  1 tablet Oral Daily  . pantoprazole  40 mg Oral BID  . pneumococcal 23 valent vaccine  0.5 mL Intramuscular Tomorrow-1000   Continuous:  LYY:[id number], diphenhydrAMINE, ipratropium-albuterol, ondansetron **OR** ondansetron (ZOFRAN) IV, promethazine  Assesment:she has obstructive pneumonia and originally refused to take antibiotics but she is taking them now. She has diabetes which has never been well controlled that part of the problem is that she has been insisting on trying to manage her own care which obviously has not worked well since her blood sugars are over 400 consistently. Today she tells me she will allow [place]  to manage her blood sugar. She is psychotic. She now has taped her nose shut to keep other organisms from entering through her orafices  Principal Problem:   Postobstructive pneumonia Active Problems:   Malignant neoplasm of larynx   Chronic ischemic heart disease   S/P laryngectomy   Anxiety disorder   Chronic obstructive pulmonary disease   Lung mass   Cough with hemoptysis   Tracheostomy dependence   Protein-calorie malnutrition, severe    Plan:continue with current treatments. Hopefully she will allow [place] to give her the insulin today. She will have repeat tele-psychiatry consult    LOS: 3 days   Seaborn Nakama L 05/02/2014, 9:01 AM

## 2014-05-02 NOTE — Consult Note (Signed)
Telepsych Consultation   Reason for Consult:  Delusional behavior Referring Physician:  [name] is an 63 y.o. female.  Assessment: AXIS I:  Delusional Disorder AXIS II:  Deferred AXIS III:   Past Medical History  Diagnosis Date  . Atypical chest pain     likely musculoskeletal  . CAD (coronary artery disease)     single vessel. S/P Cypher stent of the right coronary artery October 2006 and patent by catheterization October 2007. Normal left ventricular function. Normal exercise Cardiolite study of 67% May 2008  . Chronic obstructive pulmonary disease   . History of tobacco use     discontinued  . Dyslipidemia   . Insulin dependent diabetes mellitus   . 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 Anxiety disorder   . Hypothyroidism    AXIS IV:  other psychosocial or environmental problems AXIS V:  51-60 moderate symptoms  Plan:  No evidence of imminent risk to self or others at present.   Patient does not meet criteria for psychiatric inpatient admission. Supportive therapy provided about ongoing stressors. Refer to IOP. Discussed crisis plan, support from social network, calling 911, coming to the Emergency Department, and calling Suicide Hotline.  Subjective:   [name] is a 63 y.o. female patient admitted with reports of delusional rumination about "bugs and parasites" and other organisms which were not observed by outpatient dermatologists (reportedly) or by inpatient providers. Pt initially refused treatment for her severe hyperglycemia and her current pneumonia, but she is accepting treatment at this time. Pt denies SI, HI, and AVH. Pt is fully oriented and answering questions appropriately. When asked about refusing treatment, pt states "I didn't even know where I was when I came in because I was so sick; I was just really out of it but I'm ok now". Pt understands her course of treatment and the seriousness of her  current medical concerns. When asked about parasites and bugs, pt reports that the "were on my dog and I guess they got in my somehow, see these sores on my arms?". The sores are consistent with obsessive self-mutilation often seen with delusional disorders of this type or with anxiety disorders which may be the origin if all other medical causes can be ruled out. Pt currently does not have any committable disorders or psychiatric concerns and her behaviors are consistent with delusional disorders which can be treated on an outpatient basis.   HPI:  [name] is an 63 y.o. female. Pt presented voluntarily presented to [facility] with chief complaint of vomiting blood. Pt is now on a medical floor at [facility]. Pt is oriented x 4 and is cooperative. Pt uses an electronic voice box, so it is quite difficult to understand pt's speech. RN is bedside to pt to assist in communication. Pt reports euthymic mood and her affect is blunted. Pt denies SI and HI. She denies [facility]. She reports that parasites are "coming out from under my skin." Pt sts that she is being bitten by parasites in her home. She says that she had a baby possum and a rat in her house and they transmitted the parasites. Per Dr Luan Pulling' note on 04/30/14, "She has had skin lesions and seen multiple dermatologists with nothing being found." She says she recently saw a psychiatrist to "make sure I was okay."    HPI Elements:   Location:  Psychiatric. Quality:  Stable. Severity:  Severe. Timing:  Intermittent. Duration:  Transient. Context:  Unknown context; pt reports she saw bugs on her down then they got on her..  Past Psychiatric History: Past Medical History  Diagnosis Date  . Atypical chest pain     likely musculoskeletal  . CAD (coronary artery disease)     single vessel. S/P Cypher stent of the right coronary artery October 2006 and patent by catheterization October 2007. Normal left ventricular function. Normal exercise Cardiolite study of  67% May 2008  . Chronic obstructive pulmonary disease   . History of tobacco use     discontinued  . Dyslipidemia   . Insulin dependent diabetes mellitus   . 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 Anxiety disorder   . Hypothyroidism     reports that she quit smoking about 7 years ago. Her smoking use included Cigarettes. She has a 45 pack-year smoking history. She has never used smokeless tobacco. Her alcohol and drug histories are not on file. Family History  Problem Relation Age of Onset  . Diabetes      No FH  . Hypertension    . Coronary artery disease     Family History Substance Abuse: No Family Supports: No (pt sts family all deceased but a neighbor helps) Living Arrangements: Alone Can pt return to current living arrangement?: Yes Allergies:   Allergies  Allergen Reactions  . Atorvastatin Other (See Comments)    Reaction is unknown  . Clopidogrel Bisulfate Other (See Comments)    Reaction unknown  . Oxycodone-Acetaminophen Nausea Only and Nausea And Vomiting  . Penicillins Other (See Comments)    Reaction is uknown  . Vancomycin Itching  . Codeine Rash    ACT Assessment Complete:  Yes:    Educational Status    Risk to Self: Risk to self with the past 6 months Suicidal Ideation: No Suicidal Intent: No Is patient at risk for suicide?: No Suicidal Plan?: No Access to Means: No What has been your use of drugs/alcohol within the last 12 months?: none Previous Attempts/Gestures: No How many times?: 0 Other Self Harm Risks: none Triggers for Past Attempts:  (n/a) Intentional Self Injurious Behavior: None Family Suicide History: No Recent stressful life event(s): Other (Comment) (parasites in her body) Persecutory voices/beliefs?: No Depression: No Substance abuse history and/or treatment for substance abuse?: No Suicide prevention information given to non-admitted patients: Not applicable  Risk to Others: Risk to Others  within the past 6 months Homicidal Ideation: No Thoughts of Harm to Others: No Current Homicidal Intent: No Current Homicidal Plan: No Access to Homicidal Means: No Identified Victim: none History of harm to others?: No Assessment of Violence: None Noted Violent Behavior Description: pt denies hx violence Does patient have access to weapons?: No Criminal Charges Pending?: No Does patient have a court date: No  Abuse: Abuse/Neglect Assessment (Assessment to be complete while patient is alone) Physical Abuse: Denies Verbal Abuse: Denies Sexual Abuse: Denies Exploitation of patient/patient's resources: Denies Self-Neglect: Denies  Prior Inpatient Therapy: Prior Inpatient Therapy Prior Inpatient Therapy: Yes Prior Therapy Facilty/Provider(s): in [place] Reason for Treatment: delusions  Prior Outpatient Therapy: Prior Outpatient Therapy Prior Outpatient Therapy: Yes Prior Therapy Dates: recently Prior Therapy Facilty/Provider(s): unknown Reason for Treatment: wellness check per pt  Additional Information: Additional Information 1:1 In Past 12 Months?: No CIRT Risk: No Elopement Risk: No Does patient have medical clearance?: Yes                  Objective:  Blood pressure 101/52, pulse 92, temperature 98.6 F (37 C), temperature source Oral, resp. rate 18, height 5' (1.524 m), weight 37.2 kg (82 lb 0.2 oz), SpO2 100 %.Body mass index is 16.02 kg/(m^2). Results for orders placed or performed during the hospital encounter of 04/29/14 (from the past 72 hour(s))  Glucose, capillary     Status: Abnormal   Collection Time: 04/30/14 11:58 AM  Result Value Ref Range   Glucose-Capillary 367 (H) 70 - 99 mg/dL   Comment 1 Notify RN   Glucose, capillary     Status: Abnormal   Collection Time: 04/30/14  4:45 PM  Result Value Ref Range   Glucose-Capillary 597 (HH) 70 - 99 mg/dL   Comment 1 Notify RN   Glucose, capillary     Status: Abnormal   Collection Time: 04/30/14   5:22 PM  Result Value Ref Range   Glucose-Capillary >600 (HH) 70 - 99 mg/dL   Comment 1 Notify RN   Glucose, capillary     Status: Abnormal   Collection Time: 04/30/14  6:01 PM  Result Value Ref Range   Glucose-Capillary 519 (H) 70 - 99 mg/dL   Comment 1 Notify RN   Glucose, capillary     Status: Abnormal   Collection Time: 04/30/14  6:56 PM  Result Value Ref Range   Glucose-Capillary 476 (H) 70 - 99 mg/dL   Comment 1 Notify RN   Glucose, capillary     Status: Abnormal   Collection Time: 04/30/14  8:06 PM  Result Value Ref Range   Glucose-Capillary 280 (H) 70 - 99 mg/dL  Glucose, capillary     Status: None   Collection Time: 04/30/14 11:11 PM  Result Value Ref Range   Glucose-Capillary 75 70 - 99 mg/dL  Glucose, capillary     Status: Abnormal   Collection Time: 04/30/14 11:41 PM  Result Value Ref Range   Glucose-Capillary 137 (H) 70 - 99 mg/dL  Glucose, capillary     Status: Abnormal   Collection Time: 05/01/14 12:09 AM  Result Value Ref Range   Glucose-Capillary 174 (H) 70 - 99 mg/dL  Glucose, capillary     Status: Abnormal   Collection Time: 05/01/14  4:24 AM  Result Value Ref Range   Glucose-Capillary 465 (H) 70 - 99 mg/dL  Glucose, capillary     Status: Abnormal   Collection Time: 05/01/14  7:28 AM  Result Value Ref Range   Glucose-Capillary 417 (H) 70 - 99 mg/dL   Comment 1 Documented in Chart    Comment 2 Notify RN   Glucose, capillary     Status: Abnormal   Collection Time: 05/01/14 11:02 AM  Result Value Ref Range   Glucose-Capillary 330 (H) 70 - 99 mg/dL   Comment 1 Documented in Chart    Comment 2 Notify RN   Glucose, capillary     Status: Abnormal   Collection Time: 05/01/14  4:25 PM  Result Value Ref Range   Glucose-Capillary 339 (H) 70 - 99 mg/dL   Comment 1 Notify RN    Comment 2 Documented in Chart   Glucose, capillary     Status: Abnormal   Collection Time: 05/01/14  8:04 PM  Result Value Ref Range   Glucose-Capillary 304 (H) 70 - 99 mg/dL    Comment 1 Notify RN   Glucose, capillary     Status: Abnormal   Collection Time: 05/02/14 12:04 AM  Result Value Ref Range   Glucose-Capillary 519 (H) 70 - 99 mg/dL  Comment 1 Notify RN   Glucose, capillary     Status: Abnormal   Collection Time: 05/02/14  1:27 AM  Result Value Ref Range   Glucose-Capillary 408 (H) 70 - 99 mg/dL   Comment 1 Notify RN   Glucose, capillary     Status: Abnormal   Collection Time: 05/02/14  4:02 AM  Result Value Ref Range   Glucose-Capillary 333 (H) 70 - 99 mg/dL   Comment 1 Notify RN   Glucose, capillary     Status: Abnormal   Collection Time: 05/02/14  7:35 AM  Result Value Ref Range   Glucose-Capillary 474 (H) 70 - 99 mg/dL   Comment 1 Notify RN   Glucose, capillary     Status: Abnormal   Collection Time: 05/02/14 11:18 AM  Result Value Ref Range   Glucose-Capillary 562 (HH) 70 - 99 mg/dL   Comment 1 Notify RN   Glucose, capillary     Status: Abnormal   Collection Time: 05/02/14  2:21 PM  Result Value Ref Range   Glucose-Capillary 494 (H) 70 - 99 mg/dL   Comment 1 Notify RN   Glucose, capillary     Status: Abnormal   Collection Time: 05/02/14  4:29 PM  Result Value Ref Range   Glucose-Capillary 336 (H) 70 - 99 mg/dL   Comment 1 Notify RN   Glucose, capillary     Status: Abnormal   Collection Time: 05/02/14  5:23 PM  Result Value Ref Range   Glucose-Capillary 291 (H) 70 - 99 mg/dL  Glucose, capillary     Status: Abnormal   Collection Time: 05/02/14  8:02 PM  Result Value Ref Range   Glucose-Capillary 323 (H) 70 - 99 mg/dL   Comment 1 Notify RN   Glucose, capillary     Status: Abnormal   Collection Time: 05/02/14  9:34 PM  Result Value Ref Range   Glucose-Capillary 158 (H) 70 - 99 mg/dL   Comment 1 Notify RN   Glucose, capillary     Status: Abnormal   Collection Time: 05/02/14 10:20 PM  Result Value Ref Range   Glucose-Capillary 159 (H) 70 - 99 mg/dL   Comment 1 Notify RN   Glucose, capillary     Status: Abnormal   Collection  Time: 05/02/14 11:57 PM  Result Value Ref Range   Glucose-Capillary 298 (H) 70 - 99 mg/dL   Comment 1 Notify RN   Glucose, capillary     Status: Abnormal   Collection Time: 05/03/14  3:03 AM  Result Value Ref Range   Glucose-Capillary 402 (H) 70 - 99 mg/dL  Glucose, capillary     Status: Abnormal   Collection Time: 05/03/14  7:26 AM  Result Value Ref Range   Glucose-Capillary 228 (H) 70 - 99 mg/dL   Labs are reviewed and are pertinent for N/A for psychiatric concerns.  Current Facility-Administered Medications  Medication Dose Route Frequency Provider Last Rate Last Dose  . ALPRAZolam Duanne Moron) tablet 0.5 mg  0.5 mg Oral QID PRN Alonza Bogus, MD   0.5 mg at 05/02/14 2130  . aspirin tablet 325 mg  325 mg Oral Q6H PRN Maricela Curet, MD   325 mg at 05/02/14 2215  . diphenhydrAMINE (BENADRYL) capsule 25 mg  25 mg Oral Q6H PRN Alonza Bogus, MD      . feeding supplement (ENSURE COMPLETE) (ENSURE COMPLETE) liquid 237 mL  237 mL Oral BID BM Jeneen Rinks, RD   237 mL at 05/02/14 1634  .  insulin aspart (novoLOG) injection 1-8 Units  1-8 Units Subcutaneous Q4H Alonza Bogus, MD   6 Units at 05/02/14 2008  . insulin detemir (LEVEMIR) injection 8 Units  8 Units Subcutaneous Daily Alonza Bogus, MD   8 Units at 05/02/14 1104  . ipratropium-albuterol (DUONEB) 0.5-2.5 (3) MG/3ML nebulizer solution 3 mL  3 mL Nebulization Q6H PRN Alonza Bogus, MD   3 mL at 05/03/14 0501  . Levofloxacin (LEVAQUIN) IVPB 250 mg  250 mg Intravenous Q24H Alonza Bogus, MD   250 mg at 05/02/14 1634  . multivitamin with minerals tablet 1 tablet  1 tablet Oral Daily Jeneen Rinks, RD   1 tablet at 05/02/14 1044  . ondansetron (ZOFRAN) tablet 4 mg  4 mg Oral Q6H PRN Phillips Grout, MD       Or  . ondansetron (ZOFRAN) injection 4 mg  4 mg Intravenous Q6H PRN Phillips Grout, MD      . pantoprazole (PROTONIX) EC tablet 40 mg  40 mg Oral BID Alonza Bogus, MD   40 mg at 05/02/14 1044  . promethazine  (PHENERGAN) tablet 25 mg  25 mg Oral Q6H PRN Alonza Bogus, MD        Psychiatric Specialty Exam:     Blood pressure 101/52, pulse 92, temperature 98.6 F (37 C), temperature source Oral, resp. rate 18, height 5' (1.524 m), weight 37.2 kg (82 lb 0.2 oz), SpO2 100 %.Body mass index is 16.02 kg/(m^2).  General Appearance: Fairly Groomed  Engineer, water::  Good  Speech:  Clear and Coherent and Using assistive device secondary to prior cancer and tracheostomy.  Volume:  Normal  Mood:  Euthymic  Affect:  Appropriate and Congruent  Thought Process:  Circumstantial, Coherent and Goal Directed  Orientation:  Full (Time, Place, and Person)  Thought Content:  Delusions about parasitic infestation  Suicidal Thoughts:  No  Homicidal Thoughts:  No  Memory:  Immediate;   Fair Recent;   Fair Remote;   Fair  Judgement:  Fair  Insight:  Fair  Psychomotor Activity:  Normal  Concentration:  Good  Recall:  Fair  Akathisia:  No  Handed:    AIMS (if indicated):     Assets:  Communication Skills Resilience  Sleep:      Treatment Plan Summary: Pt's behaviors are consistent with delusional disorders. These types of disorders do not usually respond well to medication but may respond to outpatient treatment and folllowup. Medication recommendations from Psychiatry would be to start the patient on a low-dose antipsychotic such as Risperdal 0.5mg  bid. However, given the patient's severe hyperglycemia, it may be wise to delay treating the delusional disorder and defer it to an outpatient setting as these medications will negatively impact blood sugar and worsen her hyperglycemic state. If internal medicine decides to implement this medication regimen, it is recommended that pt's blood sugars be under control first. [facility] staff to assist patient with making follow-up appointments for her delusional disorder. Medications can be managed there as well.   Disposition: -Discharge home with medically cleared with  appointments for delusional disorder management (psychiatry outpatient).  *Case reviewed with Dr. Horald Pollen, Elyse Jarvis, FNP-BC 05/02/2014 3:47 AM

## 2014-05-02 NOTE — Progress Notes (Signed)
Inpatient Diabetes Program Recommendations  AACE/ADA: New Consensus Statement on Inpatient Glycemic Control (2013)  Target Ranges:  Prepandial:   less than 140 mg/dL      Peak postprandial:   less than 180 mg/dL (1-2 hours)      Critically ill patients:  140 - 180 mg/dL   Results for YANCY, HASCALL (MRN [id number]) as of 05/02/2014 07:37  Ref. Range 05/01/2014 04:24 05/01/2014 07:28 05/01/2014 11:02 05/01/2014 16:25 05/01/2014 20:04 05/02/2014 00:04 05/02/2014 01:27 05/02/2014 04:02  Glucose-Capillary Latest Range: 70-99 mg/dL 465 (H) 417 (H) 330 (H) 339 (H) 304 (H) 519 (H) 408 (H) 333 (H)   Diabetes history: DM Outpatient Diabetes medications: NPH 10 units BID, Novolin R 2 units BID  Current orders for Inpatient glycemic control: Novolog 1-8 units Q4H, Levemir 5 units daily  Inpatient Diabetes Program Recommendations Insulin - Basal: Noted Levemir was not given yesterday as it is documented that patient/family refused. Please order Levemir 10 units daily and start now. Correction (SSI): Please consider increasing Novolog correction to moderate scale. HgbA1C: Please order an A1C to evaluate glycemic control over the past 2-3 months.  Thanks, Barnie Alderman, RN, MSN, CCRN, CDE Diabetes Coordinator Inpatient Diabetes Program [phone] (Team Pager) [phone] ([facility] office) [phone] [facility] office)

## 2014-05-03 LAB — GLUCOSE, CAPILLARY
GLUCOSE-CAPILLARY: 228 mg/dL — AB (ref 70–99)
Glucose-Capillary: 298 mg/dL — ABNORMAL HIGH (ref 70–99)
Glucose-Capillary: 402 mg/dL — ABNORMAL HIGH (ref 70–99)
Glucose-Capillary: 313 mg/dL — ABNORMAL HIGH (ref 70–99)
Glucose-Capillary: 360 mg/dL — ABNORMAL HIGH (ref 70–99)
Glucose-Capillary: 248 mg/dL — ABNORMAL HIGH (ref 70–99)

## 2014-05-03 MED ORDER — SODIUM CHLORIDE 0.9 % IJ SOLN: 3.0000 mL | INTRAMUSCULAR | Status: DC | PRN

## 2014-05-03 MED ORDER — SODIUM CHLORIDE 0.9 % IV SOLN
250.0000 mL | INTRAVENOUS | Status: DC | PRN
Start: 2014-05-03 — End: 2014-05-06
  Administered 2014-05-03 – 2014-05-05 (×2): 250 mL via INTRAVENOUS

## 2014-05-03 MED ORDER — INSULIN ASPART 100 UNIT/ML ~~LOC~~ SOLN
10.0000 [IU] | Freq: Once | SUBCUTANEOUS | Status: AC
  Administered 2014-05-03: 10 [IU] via SUBCUTANEOUS

## 2014-05-03 NOTE — Clinical Social Work Psychosocial (Signed)
Clinical Social Work Department BRIEF PSYCHOSOCIAL ASSESSMENT 05/03/2014  Patient:  [name], [name]     Account Number:  [id number]     Admit date:  04/29/2014  Clinical Social Worker:  Wyatt Haste  Date/Time:  05/03/2014 11:56 AM  Referred by:  CSW  Date Referred:  05/03/2014 Referred for  Behavioral Health Issues   Other Referral:   Interview type:  Patient Other interview type:    PSYCHOSOCIAL DATA Living Status:  ALONE Admitted from facility:   Level of care:   Primary support name:  Jimmy/Jerry Primary support relationship to patient:  FRIEND Degree of support available:   adequate    CURRENT CONCERNS Current Concerns  Behavioral Health Issues   Other Concerns:    SOCIAL WORK ASSESSMENT / PLAN CSW met with pt at bedside. Pt uses electronic voice box. She was alert and oriented x4 and reports that she has pneumonia again for the fourth time. Pt indicates she is very tired, because she was up most of the night, but is willing to talk with CSW. Pt states she lives alone and generally manages fine. She describes her best support as her preacher, Sonia Side and his wife as well as a Industrial/product designer, Medical sales representative. Pt reports all of her family has died. She is able to call Sonia Side or Laverna Peace when she needs something. Pt states that she was driving prior to getting sick and was able to go get groceries, etc. Laverna Peace has helped her with this as well. She ususally has microwave meals or Jimmy or Sonia Side will bring food in for her. CSW asked pt about d/c plan and she intends to return home. CSW questioned her about possible ALF/family care home and pt states, "Oh, please do I look that old?" Pt feels she can manage sufficiently at home on her own.    Pt was assessed by psychiatry earlier in the week and inpatient was recommended. Yesterday, she was reassessed and outpatient follow up is felt to be sufficient. Pt recalls assessment with Heloise Purpura yesterday and was interested in his recommendations. CSW  discussed outpatient follow up. She states that she has never needed a psychiatrist or counselor. Pt is agreeable to CSW making appointment for pt at [facility] [place]. Appointment made for Tuesday 11/10 at 11:30. CSW will notify pt. Pt states that there are parasites at her home. She plans to have a professional inspection soon. Per MD, this is a long standing delusion for pt, approximately one year. CSW discussed with MD who plans to d/c pt most likely tomorrow. CM also aware and will arrange full home health services, including a Education officer, museum.   Assessment/plan status:  Psychosocial Support/Ongoing Assessment of Needs Other assessment/ plan:   Information/referral to community resources:   [facility] [employer]    PATIENT'S/FAMILY'S RESPONSE TO PLAN OF CARE: Pt has follow up appointment at [facility] in [facility] next week. Anticipate d/c tomorrow with home health services.       Benay Pike, [place]

## 2014-05-03 NOTE — Progress Notes (Signed)
NUTRITION FOLLOW UP  Pt meets criteria for severe MALNUTRITION in the context of chronic illness as evidenced by severe wasting (clavicle and acromion, patella, anterior thigh) and severe fat mass loss to upper arms and thorasic region.  Intervention:   -Continue Ensure Complete po BID, each supplement provides 350 kcal and 13 grams of protein  - Continue MVI daily  Nutrition Dx:   Inadequate oral intake related to peumonia and altered mental status evidenced by severe wasting of muscle and fat mass; progressing  Goal:   Pt to meet >/= 90% of their estimated nutrition needs  Monitor:   Po intake, labs and wt trends   Assessment:   Pt lives alone. She presents with pneumonia and she has multiple skin lesions. 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 C/o being very tired today. She was able to eat 100% of breakfast and lunch. Feeds herself. Intake continues be good; she continues to consume 100% of meals. She also is accepting Ensure Complete po BID, each supplement provides 350 kcal and 13 grams of protein. Will continue supplement to help preserve lean body mass.  Blood sugars continue to be high, due to pt refusing insulin.  Discharge disposition continues to be inpatient psychiatric placement once medically stable for transfer.  Labs reviewed. Na: 131, Cl: 87, BUN: 31, Glucose: 522. CBGs: 228-402.  Height: Ht Readings from Last 1 Encounters:  04/30/14 5' (1.524 m)    Weight Status:   Wt Readings from Last 1 Encounters:  04/30/14 82 lb 0.2 oz (37.2 kg)    Re-estimated needs:  Kcal: 1295-1480 (to prevent further weight loss) Protein: 55-62 gr Fluid: 1.3-1.5 liters daily  Skin: multiple skin lesions and Stage II to sacrum  Diet Order: Diet Carb Modified   Intake/Output Summary (Last 24 hours) at 05/03/14 1215 Last data filed at 05/03/14 1025  Gross per 24 hour  Intake    480 ml  Output    875 ml  Net   -395 ml    Last BM:  05/01/14   Labs:   Recent Labs Lab 04/29/14 1947 04/30/14 0601  NA 138 131*  K 3.4* 4.0  CL 93* 87*  CO2 31 30  BUN 39* 31*  CREATININE 1.12* 0.92  CALCIUM 9.8 9.4  GLUCOSE 45* 522*    CBG (last 3)   Recent Labs  05/02/14 2357 05/03/14 0303 05/03/14 0726  GLUCAP 298* 402* 228*   Scheduled Meds: . feeding supplement (ENSURE COMPLETE)  237 mL Oral BID BM  . insulin aspart  1-8 Units Subcutaneous Q4H  . insulin detemir  8 Units Subcutaneous Daily  . levofloxacin (LEVAQUIN) IV  250 mg Intravenous Q24H  . multivitamin with minerals  1 tablet Oral Daily  . pantoprazole  40 mg Oral BID    Continuous Infusions:   Timmie Dugue A. Jimmye Norman, RD, LDN Pager: [phone]

## 2014-05-03 NOTE — Progress Notes (Signed)
Results for OTTO, CARAWAY (MRN [id number]) as of 05/03/2014 13:15  Ref. Range 05/03/2014 03:03 05/03/2014 07:26 05/03/2014 11:33  Glucose-Capillary Latest Range: 70-99 mg/dL 402 (H) 228 (H) 313 (H)  CBGs today have continued to be greater than 180 mg/dl.  Recommend changing Novolog correction scale to TID & HS since patient is eating 100% of meals.  May need to increase Levemir to 10 units daily if CBGs continue to be elevated.  Harvel Ricks RN BSN CDE

## 2014-05-03 NOTE — Progress Notes (Signed)
Subjective: This morning she is somewhat withdrawn and will not talk to me. She has no other new complaints noted. She is sleepy. Her blood sugar had done much better and then last night she refuses to take insulin so her blood sugar was over 400 again this morning.  Objective: Vital signs in last 24 hours: Temp:  [98.4 F (36.9 C)-99.8 F (37.7 C)] 98.6 F (37 C) (11/05 0629) Pulse Rate:  [92-108] 92 (11/05 0629) Resp:  [18] 18 (11/05 0629) BP: (100-117)/(42-58) 101/52 mmHg (11/05 0629) SpO2:  [95 %-100 %] 100 % (11/05 0629) Weight change:  Last BM Date: 05/01/14  Intake/Output from previous day: 11/04 0701 - 11/05 0700 In: 600 [P.O.:600] Out: 925 [Urine:925]  PHYSICAL EXAM General appearance: alert and very thin. She still has her nose taped shut. Resp: she has bilateral rhonchi. She has permanent tracheostomy the site looks okay Cardio: regular rate and rhythm, S1, S2 normal, no murmur, click, rub or gallop GI: soft, non-tender; bowel sounds normal; no masses,  no organomegaly Extremities: extremities normal, atraumatic, no cyanosis or edema  Lab Results:  Results for orders placed or performed during the hospital encounter of 04/29/14 (from the past 48 hour(s))  Glucose, capillary     Status: Abnormal   Collection Time: 05/01/14 11:02 AM  Result Value Ref Range   Glucose-Capillary 330 (H) 70 - 99 mg/dL   Comment 1 Documented in Chart    Comment 2 Notify RN   Glucose, capillary     Status: Abnormal   Collection Time: 05/01/14  4:25 PM  Result Value Ref Range   Glucose-Capillary 339 (H) 70 - 99 mg/dL   Comment 1 Notify RN    Comment 2 Documented in Chart   Glucose, capillary     Status: Abnormal   Collection Time: 05/01/14  8:04 PM  Result Value Ref Range   Glucose-Capillary 304 (H) 70 - 99 mg/dL   Comment 1 Notify RN   Glucose, capillary     Status: Abnormal   Collection Time: 05/02/14 12:04 AM  Result Value Ref Range   Glucose-Capillary 519 (H) 70 - 99 mg/dL   Comment 1 Notify RN   Glucose, capillary     Status: Abnormal   Collection Time: 05/02/14  1:27 AM  Result Value Ref Range   Glucose-Capillary 408 (H) 70 - 99 mg/dL   Comment 1 Notify RN   Glucose, capillary     Status: Abnormal   Collection Time: 05/02/14  4:02 AM  Result Value Ref Range   Glucose-Capillary 333 (H) 70 - 99 mg/dL   Comment 1 Notify RN   Glucose, capillary     Status: Abnormal   Collection Time: 05/02/14  7:35 AM  Result Value Ref Range   Glucose-Capillary 474 (H) 70 - 99 mg/dL   Comment 1 Notify RN   Glucose, capillary     Status: Abnormal   Collection Time: 05/02/14 11:18 AM  Result Value Ref Range   Glucose-Capillary 562 (HH) 70 - 99 mg/dL   Comment 1 Notify RN   Glucose, capillary     Status: Abnormal   Collection Time: 05/02/14  2:21 PM  Result Value Ref Range   Glucose-Capillary 494 (H) 70 - 99 mg/dL   Comment 1 Notify RN   Glucose, capillary     Status: Abnormal   Collection Time: 05/02/14  4:29 PM  Result Value Ref Range   Glucose-Capillary 336 (H) 70 - 99 mg/dL   Comment 1 Notify RN  Glucose, capillary     Status: Abnormal   Collection Time: 05/02/14  5:23 PM  Result Value Ref Range   Glucose-Capillary 291 (H) 70 - 99 mg/dL  Glucose, capillary     Status: Abnormal   Collection Time: 05/02/14  8:02 PM  Result Value Ref Range   Glucose-Capillary 323 (H) 70 - 99 mg/dL   Comment 1 Notify RN   Glucose, capillary     Status: Abnormal   Collection Time: 05/02/14  9:34 PM  Result Value Ref Range   Glucose-Capillary 158 (H) 70 - 99 mg/dL   Comment 1 Notify RN   Glucose, capillary     Status: Abnormal   Collection Time: 05/02/14 10:20 PM  Result Value Ref Range   Glucose-Capillary 159 (H) 70 - 99 mg/dL   Comment 1 Notify RN   Glucose, capillary     Status: Abnormal   Collection Time: 05/02/14 11:57 PM  Result Value Ref Range   Glucose-Capillary 298 (H) 70 - 99 mg/dL   Comment 1 Notify RN   Glucose, capillary     Status: Abnormal   Collection  Time: 05/03/14  3:03 AM  Result Value Ref Range   Glucose-Capillary 402 (H) 70 - 99 mg/dL  Glucose, capillary     Status: Abnormal   Collection Time: 05/03/14  7:26 AM  Result Value Ref Range   Glucose-Capillary 228 (H) 70 - 99 mg/dL    ABGS No results for input(s): PHART, PO2ART, TCO2, HCO3 in the last 72 hours.  Invalid input(s): PCO2 CULTURES No results found for this or any previous visit (from the past 240 hour(s)). Studies/Results: No results found.  Medications:  Prior to Admission:  Prescriptions prior to admission  Medication Sig Dispense Refill Last Dose  . ALPRAZolam (XANAX) 0.5 MG tablet Take 0.5 mg by mouth 6 (six) times daily. *May take up 7 times daily as needed for anxiety   04/29/2014 at Unknown time  . esomeprazole (NEXIUM) 20 MG capsule Take 20 mg by mouth daily as needed (for acid reflux). Patient states that she has to open capsule and mix with applesauce to take   Past Week at Unknown time  . insulin NPH (HUMULIN N) 100 UNIT/ML injection Inject 10 Units into the skin 2 (two) times daily as needed. Use as directed based on sliding scale instructions   04/29/2014 at Unknown time  . promethazine (PHENERGAN) 25 MG tablet Take 25 mg by mouth 4 (four) times daily.    04/29/2014 at Unknown time  . aspirin 81 MG EC tablet Take 81 mg by mouth daily.     Taking  . diphenhydrAMINE (BENADRYL) 25 MG tablet Take 25 mg by mouth every 6 (six) hours as needed for allergies.    unknown  . HUMULIN R 100 UNIT/ML injection Inject 2 Units into the skin 2 (two) times daily as needed for high blood sugar.    unknown  . levothyroxine (SYNTHROID, LEVOTHROID) 50 MCG tablet Take 1 1/2 tabs daily     . nitroGLYCERIN (NITROSTAT) 0.4 MG SL tablet Place 0.4 mg under the tongue every 5 (five) minutes as needed.     unknown  . predniSONE (DELTASONE) 5 MG tablet Take 1 tablet by mouth as needed (for allergic reactions).    unknown  . TOPROL XL 25 MG 24 hr tablet TAKE (1) TABLET ONCE DAILY AS NEEDED.  30 each 6 Taking   Scheduled: . feeding supplement (ENSURE COMPLETE)  237 mL Oral BID BM  . insulin aspart  1-8 Units Subcutaneous Q4H  . insulin detemir  8 Units Subcutaneous Daily  . levofloxacin (LEVAQUIN) IV  250 mg Intravenous Q24H  . multivitamin with minerals  1 tablet Oral Daily  . pantoprazole  40 mg Oral BID   Continuous:  HYI:FOYDXAJOIN, aspirin, diphenhydrAMINE, ipratropium-albuterol, ondansetron **OR** ondansetron (ZOFRAN) IV, promethazine  Assesment:she was admitted with obstructive pneumonia and that seems to be getting better. She has severe protein calorie malnutrition related to her multiple malignancies. She has psychotic disorder and we are hoping to have her transferred to a [facility]. She has diabetes and her blood sugar was well controlled but then she refused to take her insulin and it's up again.  Principal Problem:   Postobstructive pneumonia Active Problems:   Malignant neoplasm of larynx   Chronic ischemic heart disease   S/P laryngectomy   Anxiety disorder   Chronic obstructive pulmonary disease   Lung mass   Cough with hemoptysis   Tracheostomy dependence   Protein-calorie malnutrition, severe   Delusional disorder    Plan:continue current treatments.    LOS: 4 days   Amal Saiki L 05/03/2014, 8:59 AM

## 2014-05-04 DIAGNOSIS — E1165 Type 2 diabetes mellitus with hyperglycemia: Secondary | ICD-10-CM | POA: Diagnosis present

## 2014-05-04 DIAGNOSIS — IMO0002 Reserved for concepts with insufficient information to code with codable children: Secondary | ICD-10-CM | POA: Diagnosis present

## 2014-05-04 DIAGNOSIS — E118 Type 2 diabetes mellitus with unspecified complications: Secondary | ICD-10-CM

## 2014-05-04 LAB — GLUCOSE, CAPILLARY
GLUCOSE-CAPILLARY: 206 mg/dL — AB (ref 70–99)
GLUCOSE-CAPILLARY: 387 mg/dL — AB (ref 70–99)
GLUCOSE-CAPILLARY: 541 mg/dL — AB (ref 70–99)
GLUCOSE-CAPILLARY: 277 mg/dL — AB (ref 70–99)
Glucose-Capillary: 54 mg/dL — ABNORMAL LOW (ref 70–99)
Glucose-Capillary: 515 mg/dL — ABNORMAL HIGH (ref 70–99)
Glucose-Capillary: 421 mg/dL — ABNORMAL HIGH (ref 70–99)
Glucose-Capillary: 314 mg/dL — ABNORMAL HIGH (ref 70–99)
Glucose-Capillary: 314 mg/dL — ABNORMAL HIGH (ref 70–99)

## 2014-05-04 MED ORDER — MENTHOL 3 MG MT LOZG
1.0000 | LOZENGE | OROMUCOSAL | Status: DC | PRN
  Filled 2014-05-04: qty 9

## 2014-05-04 MED ORDER — ADULT MULTIVITAMIN W/MINERALS CH: 1.0000 | ORAL_TABLET | Freq: Every day | ORAL | Status: AC

## 2014-05-04 MED ORDER — LEVOFLOXACIN 250 MG PO TABS: 500.0000 mg | ORAL_TABLET | ORAL | Status: DC

## 2014-05-04 MED ORDER — INSULIN ASPART 100 UNIT/ML ~~LOC~~ SOLN
8.0000 [IU] | Freq: Once | SUBCUTANEOUS | Status: AC
  Administered 2014-05-04: 8 [IU] via SUBCUTANEOUS

## 2014-05-04 MED ORDER — IPRATROPIUM-ALBUTEROL 0.5-2.5 (3) MG/3ML IN SOLN: 3.0000 mL | Freq: Four times a day (QID) | RESPIRATORY_TRACT | Status: AC | PRN

## 2014-05-04 MED ORDER — MENTHOL 3 MG MT LOZG: 1.0000 | LOZENGE | OROMUCOSAL | Status: AC | PRN

## 2014-05-04 MED ORDER — LEVOFLOXACIN 250 MG PO TABS
250.0000 mg | ORAL_TABLET | Freq: Every day | ORAL | Status: DC
  Administered 2014-05-04 – 2014-05-06 (×3): 250 mg via ORAL
  Filled 2014-05-04 (×3): qty 1

## 2014-05-04 NOTE — Progress Notes (Signed)
Subjective: She says she feels better as far as the pneumonia is concerned. It is not recommended any more that she have inpatient psychiatric care. Her blood sugar is still up and down and that is pretty much her pattern at home as well. Blood sugar greater than 500 this morning however.  Objective: Vital signs in last 24 hours: Temp:  [98.4 F (36.9 C)-99 F (37.2 C)] 98.7 F (37.1 C) (11/06 0425) Pulse Rate:  [87-95] 87 (11/06 0425) Resp:  [18] 18 (11/06 0425) BP: (103-111)/(55-57) 103/55 mmHg (11/06 0425) SpO2:  [95 %-100 %] 95 % (11/06 0425) Weight change:  Last BM Date:  (pt said she hasnt had a real BM in months)  Intake/Output from previous day: 11/05 0701 - 11/06 0700 In: 360 [P.O.:360] Out: 475 [Urine:475]  PHYSICAL EXAM General appearance: alert and cachectic Resp: rhonchi bilaterally Cardio: regular rate and rhythm, S1, S2 normal, no murmur, click, rub or gallop GI: soft, non-tender; bowel sounds normal; no masses,  no organomegaly Extremities: extremities normal, atraumatic, no cyanosis or edema  Lab Results:  Results for orders placed or performed during the hospital encounter of 04/29/14 (from the past 48 hour(s))  Glucose, capillary     Status: Abnormal   Collection Time: 05/02/14 11:18 AM  Result Value Ref Range   Glucose-Capillary 562 (HH) 70 - 99 mg/dL   Comment 1 Notify RN   Glucose, capillary     Status: Abnormal   Collection Time: 05/02/14  2:21 PM  Result Value Ref Range   Glucose-Capillary 494 (H) 70 - 99 mg/dL   Comment 1 Notify RN   Glucose, capillary     Status: Abnormal   Collection Time: 05/02/14  4:29 PM  Result Value Ref Range   Glucose-Capillary 336 (H) 70 - 99 mg/dL   Comment 1 Notify RN   Glucose, capillary     Status: Abnormal   Collection Time: 05/02/14  5:23 PM  Result Value Ref Range   Glucose-Capillary 291 (H) 70 - 99 mg/dL  Glucose, capillary     Status: Abnormal   Collection Time: 05/02/14  8:02 PM  Result Value Ref Range   Glucose-Capillary 323 (H) 70 - 99 mg/dL   Comment 1 Notify RN   Glucose, capillary     Status: Abnormal   Collection Time: 05/02/14  9:34 PM  Result Value Ref Range   Glucose-Capillary 158 (H) 70 - 99 mg/dL   Comment 1 Notify RN   Glucose, capillary     Status: Abnormal   Collection Time: 05/02/14 10:20 PM  Result Value Ref Range   Glucose-Capillary 159 (H) 70 - 99 mg/dL   Comment 1 Notify RN   Glucose, capillary     Status: Abnormal   Collection Time: 05/02/14 11:57 PM  Result Value Ref Range   Glucose-Capillary 298 (H) 70 - 99 mg/dL   Comment 1 Notify RN   Glucose, capillary     Status: Abnormal   Collection Time: 05/03/14  3:03 AM  Result Value Ref Range   Glucose-Capillary 402 (H) 70 - 99 mg/dL  Glucose, capillary     Status: Abnormal   Collection Time: 05/03/14  7:26 AM  Result Value Ref Range   Glucose-Capillary 228 (H) 70 - 99 mg/dL  Glucose, capillary     Status: Abnormal   Collection Time: 05/03/14 11:33 AM  Result Value Ref Range   Glucose-Capillary 313 (H) 70 - 99 mg/dL  Glucose, capillary     Status: Abnormal   Collection Time:  05/03/14  4:27 PM  Result Value Ref Range   Glucose-Capillary 360 (H) 70 - 99 mg/dL   Comment 1 Notify RN   Glucose, capillary     Status: Abnormal   Collection Time: 05/03/14  8:49 PM  Result Value Ref Range   Glucose-Capillary 248 (H) 70 - 99 mg/dL   Comment 1 Notify RN   Glucose, capillary     Status: Abnormal   Collection Time: 05/04/14 12:11 AM  Result Value Ref Range   Glucose-Capillary 54 (L) 70 - 99 mg/dL   Comment 1 Notify RN   Glucose, capillary     Status: Abnormal   Collection Time: 05/04/14  1:16 AM  Result Value Ref Range   Glucose-Capillary 206 (H) 70 - 99 mg/dL   Comment 1 Notify RN   Glucose, capillary     Status: Abnormal   Collection Time: 05/04/14  4:26 AM  Result Value Ref Range   Glucose-Capillary 387 (H) 70 - 99 mg/dL   Comment 1 Notify RN   Glucose, capillary     Status: Abnormal   Collection Time:  05/04/14  8:11 AM  Result Value Ref Range   Glucose-Capillary 515 (H) 70 - 99 mg/dL   Comment 1 Notify RN     ABGS No results for input(s): PHART, PO2ART, TCO2, HCO3 in the last 72 hours.  Invalid input(s): PCO2 CULTURES No results found for this or any previous visit (from the past 240 hour(s)). Studies/Results: No results found.  Medications:  Prior to Admission:  Prescriptions prior to admission  Medication Sig Dispense Refill Last Dose  . ALPRAZolam (XANAX) 0.5 MG tablet Take 0.5 mg by mouth 6 (six) times daily. *May take up 7 times daily as needed for anxiety   04/29/2014 at Unknown time  . esomeprazole (NEXIUM) 20 MG capsule Take 20 mg by mouth daily as needed (for acid reflux). Patient states that she has to open capsule and mix with applesauce to take   Past Week at Unknown time  . insulin NPH (HUMULIN N) 100 UNIT/ML injection Inject 10 Units into the skin 2 (two) times daily as needed. Use as directed based on sliding scale instructions   04/29/2014 at Unknown time  . promethazine (PHENERGAN) 25 MG tablet Take 25 mg by mouth 4 (four) times daily.    04/29/2014 at Unknown time  . aspirin 81 MG EC tablet Take 81 mg by mouth daily.     Taking  . diphenhydrAMINE (BENADRYL) 25 MG tablet Take 25 mg by mouth every 6 (six) hours as needed for allergies.    unknown  . HUMULIN R 100 UNIT/ML injection Inject 2 Units into the skin 2 (two) times daily as needed for high blood sugar.    unknown  . levothyroxine (SYNTHROID, LEVOTHROID) 50 MCG tablet Take 1 1/2 tabs daily     . nitroGLYCERIN (NITROSTAT) 0.4 MG SL tablet Place 0.4 mg under the tongue every 5 (five) minutes as needed.     unknown  . predniSONE (DELTASONE) 5 MG tablet Take 1 tablet by mouth as needed (for allergic reactions).    unknown  . TOPROL XL 25 MG 24 hr tablet TAKE (1) TABLET ONCE DAILY AS NEEDED. 30 each 6 Taking   Scheduled: . feeding supplement (ENSURE COMPLETE)  237 mL Oral BID BM  . insulin aspart  1-8 Units  Subcutaneous Q4H  . insulin detemir  8 Units Subcutaneous Daily  . levofloxacin (LEVAQUIN) IV  250 mg Intravenous Q24H  . multivitamin with  minerals  1 tablet Oral Daily  . pantoprazole  40 mg Oral BID   Continuous:  VLD:KCCQFJ chloride, ALPRAZolam, aspirin, diphenhydrAMINE, ipratropium-albuterol, ondansetron **OR** ondansetron (ZOFRAN) IV, promethazine, sodium chloride  Assesment:she was admitted with obstructive pneumonia. She has multiple other medical problems including lung cancer that is not being treated. She has severe protein calorie malnutrition. She has diabetes and her blood sugar is with wild fluctuations which is her normal situation at home. She has delusional disorder and has follow-up arranged at the [facility] and family.  Principal Problem:   Postobstructive pneumonia Active Problems:   Malignant neoplasm of larynx   Chronic ischemic heart disease   S/P laryngectomy   Anxiety disorder   Chronic obstructive pulmonary disease   Lung mass   Cough with hemoptysis   Tracheostomy dependence   Protein-calorie malnutrition, severe   Delusional disorder    Plan:she is complaining of some sore throat so she'll try some lozenges first and then after that if her blood sugar comes down she may be able to go home later today    LOS: 5 days   Jamekia Gannett L 05/04/2014, 8:50 AM

## 2014-05-04 NOTE — Plan of Care (Signed)
Problem: Phase III Progression Outcomes Goal: Foley discontinued Outcome: Not Applicable Date Met:  05/04/14     

## 2014-05-04 NOTE — Progress Notes (Signed)
Pt CBG  Is 515. Notified Dr. Luan Pulling. He stated to give the pt. 8 units of Novolog. Will recheck CBG and continue to monitor.

## 2014-05-04 NOTE — Progress Notes (Signed)
PHARMACIST - PHYSICIAN COMMUNICATION DR:   Hawkins CONCERNING: Antibiotic IV to Oral Route Change Policy  RECOMMENDATION: This patient is receiving Levaquin by the intravenous route.  Based on criteria approved by the Pharmacy and Therapeutics Committee, the antibiotic(s) is/are being converted to the equivalent oral dose form(s).  DESCRIPTION: These criteria include:  Patient being treated for a respiratory tract infection, urinary tract infection, cellulitis or clostridium difficile associated diarrhea if on metronidazole  The patient is not neutropenic and does not exhibit a GI malabsorption state  The patient is eating (either orally or via tube) and/or has been taking other orally administered medications for a least 24 hours  The patient is improving clinically and has a Tmax < 100.5  If you have questions about this conversion, please contact the Pharmacy Department  [x]  ( [phone]   []  ( [phone]    []  ( [phone]  [facility] []  ( [phone] )  [facility]   S. Victory Dresden, PharmD  

## 2014-05-04 NOTE — Progress Notes (Signed)
Notified Dr. Luan Pulling of the patients blood glucose after 8 units Novolog given this am.  Voiced to him that prior to giving the Levimir 8 units we rechecked the CBG and it was 542.  New orders given and followed.  Verbalized to the MD that the patient had eatten her breakfast this am.

## 2014-05-04 NOTE — Plan of Care (Signed)
Problem: Phase II Progression Outcomes Goal: IV changed to normal saline lock Outcome: Completed/Met Date Met:  05/04/14  Problem: Phase III Progression Outcomes Goal: Pain controlled on oral analgesia Outcome: Progressing Goal: Activity at appropriate level-compared to baseline (UP IN CHAIR FOR HEMODIALYSIS)  Outcome: Progressing Goal: Voiding independently Outcome: Completed/Met Date Met:  05/04/14 Goal: Discharge plan remains appropriate-arrangements made Outcome: Progressing

## 2014-05-04 NOTE — Progress Notes (Signed)
Inpatient Diabetes Program Recommendations  AACE/ADA: New Consensus Statement on Inpatient Glycemic Control (2013)  Target Ranges:  Prepandial:   less than 140 mg/dL      Peak postprandial:   less than 180 mg/dL (1-2 hours)      Critically ill patients:  140 - 180 mg/dL   Inpatient Diabetes Program Recommendations Insulin - Basal: Pt basal NPH at home 10 units bid.  Please either order NPH or levemir at 8 units bid Correction (SSI): Noted correction ordered q 4 hrs.  However, following hypoglycemia early am today, no correction given until breakfast. Appears that the q 4 hrs is not effective at controlling basal needs. Again, recommend using NPH or levemir 8 units bid. HgbA1C: Please order an A1C to evaluate glycemic control over the past 2-3 months.  Thank you, Rosita Kea, RN, CNS, Diabetes Coordinator [phone])

## 2014-05-05 LAB — GLUCOSE, CAPILLARY
GLUCOSE-CAPILLARY: 439 mg/dL — AB (ref 70–99)
GLUCOSE-CAPILLARY: 331 mg/dL — AB (ref 70–99)
GLUCOSE-CAPILLARY: 115 mg/dL — AB (ref 70–99)
Glucose-Capillary: 114 mg/dL — ABNORMAL HIGH (ref 70–99)
Glucose-Capillary: 208 mg/dL — ABNORMAL HIGH (ref 70–99)
Glucose-Capillary: 238 mg/dL — ABNORMAL HIGH (ref 70–99)
Glucose-Capillary: 437 mg/dL — ABNORMAL HIGH (ref 70–99)
Glucose-Capillary: 531 mg/dL — ABNORMAL HIGH (ref 70–99)

## 2014-05-05 MED ORDER — GLUCERNA 1.2 CAL PO LIQD: 1000.0000 mL | ORAL | Status: DC

## 2014-05-05 MED ORDER — INSULIN ASPART 100 UNIT/ML ~~LOC~~ SOLN
6.0000 [IU] | Freq: Once | SUBCUTANEOUS | Status: AC
  Administered 2014-05-05: 6 [IU] via SUBCUTANEOUS

## 2014-05-05 MED ORDER — INSULIN DETEMIR 100 UNIT/ML ~~LOC~~ SOLN
10.0000 [IU] | Freq: Every day | SUBCUTANEOUS | Status: DC
  Administered 2014-05-05 – 2014-05-06 (×2): 10 [IU] via SUBCUTANEOUS
  Filled 2014-05-05 (×3): qty 0.1

## 2014-05-05 MED ORDER — GLUCERNA SHAKE PO LIQD
237.0000 mL | Freq: Three times a day (TID) | ORAL | Status: DC
  Administered 2014-05-05 – 2014-05-06 (×4): 237 mL via ORAL

## 2014-05-05 NOTE — Progress Notes (Signed)
0900 Notified Dr. Luan Pulling of the patients CBG greater than 400 this am for breakfast.  Recommendations to change boost to glucernia.  New orders was given and followed.   1000 Updated Dr. Luan Pulling that the [facility] was 437 after 8 units of novolog, but the patient had yet to get her Levimer due to the pharmacy had not brought it up yet, plus the patient was eating breakfast.

## 2014-05-05 NOTE — Progress Notes (Signed)
Subjective: She is awake and alert. She says she still has some cough. She continues to have trouble with her blood sugar.  Objective: Vital signs in last 24 hours: Temp:  [99 F (37.2 C)-99.1 F (37.3 C)] 99 F (37.2 C) (11/07 0647) Pulse Rate:  [100-106] 103 (11/07 0647) Resp:  [16] 16 (11/07 0647) BP: (104-105)/(38-58) 104/58 mmHg (11/07 0647) SpO2:  [96 %-99 %] 98 % (11/07 0647) Weight change:  Last BM Date: 05/04/14  Intake/Output from previous day: 11/06 0701 - 11/07 0700 In: -  Out: 800 [Urine:800]  PHYSICAL EXAM General appearance: alert, cooperative and mild distress Resp: rhonchi bilaterally Cardio: regular rate and rhythm, S1, S2 normal, no murmur, click, rub or gallop GI: soft, non-tender; bowel sounds normal; no masses,  no organomegaly Extremities: extremities normal, atraumatic, no cyanosis or edema  Lab Results:  Results for orders placed or performed during the hospital encounter of 04/29/14 (from the past 48 hour(s))  Glucose, capillary     Status: Abnormal   Collection Time: 05/03/14 11:33 AM  Result Value Ref Range   Glucose-Capillary 313 (H) 70 - 99 mg/dL  Glucose, capillary     Status: Abnormal   Collection Time: 05/03/14  4:27 PM  Result Value Ref Range   Glucose-Capillary 360 (H) 70 - 99 mg/dL   Comment 1 Notify RN   Glucose, capillary     Status: Abnormal   Collection Time: 05/03/14  8:49 PM  Result Value Ref Range   Glucose-Capillary 248 (H) 70 - 99 mg/dL   Comment 1 Notify RN   Glucose, capillary     Status: Abnormal   Collection Time: 05/04/14 12:11 AM  Result Value Ref Range   Glucose-Capillary 54 (Rhodes) 70 - 99 mg/dL   Comment 1 Notify RN   Glucose, capillary     Status: Abnormal   Collection Time: 05/04/14  1:16 AM  Result Value Ref Range   Glucose-Capillary 206 (H) 70 - 99 mg/dL   Comment 1 Notify RN   Glucose, capillary     Status: Abnormal   Collection Time: 05/04/14  4:26 AM  Result Value Ref Range   Glucose-Capillary 387 (H)  70 - 99 mg/dL   Comment 1 Notify RN   Glucose, capillary     Status: Abnormal   Collection Time: 05/04/14  8:11 AM  Result Value Ref Range   Glucose-Capillary 515 (H) 70 - 99 mg/dL   Comment 1 Notify RN   Glucose, capillary     Status: Abnormal   Collection Time: 05/04/14  9:38 AM  Result Value Ref Range   Glucose-Capillary 541 (H) 70 - 99 mg/dL   Comment 1 Call MD NNP PA CNM   Glucose, capillary     Status: Abnormal   Collection Time: 05/04/14 11:45 AM  Result Value Ref Range   Glucose-Capillary 421 (H) 70 - 99 mg/dL   Comment 1 Notify RN   Glucose, capillary     Status: Abnormal   Collection Time: 05/04/14  3:25 PM  Result Value Ref Range   Glucose-Capillary 314 (H) 70 - 99 mg/dL  Glucose, capillary     Status: Abnormal   Collection Time: 05/04/14  5:07 PM  Result Value Ref Range   Glucose-Capillary 314 (H) 70 - 99 mg/dL   Comment 1 Notify RN   Glucose, capillary     Status: Abnormal   Collection Time: 05/04/14  9:13 PM  Result Value Ref Range   Glucose-Capillary 277 (H) 70 - 99 mg/dL  Glucose, capillary     Status: Abnormal   Collection Time: 05/05/14 12:38 AM  Result Value Ref Range   Glucose-Capillary 114 (H) 70 - 99 mg/dL   Comment 1 Documented in Chart    Comment 2 Notify RN   Glucose, capillary     Status: Abnormal   Collection Time: 05/05/14  4:07 AM  Result Value Ref Range   Glucose-Capillary 531 (H) 70 - 99 mg/dL   Comment 1 Documented in Chart    Comment 2 Notify RN   Glucose, capillary     Status: Abnormal   Collection Time: 05/05/14  7:34 AM  Result Value Ref Range   Glucose-Capillary 439 (H) 70 - 99 mg/dL   Comment 1 Notify RN   Glucose, capillary     Status: Abnormal   Collection Time: 05/05/14 10:08 AM  Result Value Ref Range   Glucose-Capillary 437 (H) 70 - 99 mg/dL   Comment 1 Notify RN     ABGS No results for input(s): PHART, PO2ART, TCO2, HCO3 in the last 72 hours.  Invalid input(s): PCO2 CULTURES No results found for this or any  previous visit (from the past 240 hour(s)). Studies/Results: No results found.  Medications:  Prior to Admission:  Prescriptions prior to admission  Medication Sig Dispense Refill Last Dose  . ALPRAZolam (XANAX) 0.5 MG tablet Take 0.5 mg by mouth 6 (six) times daily. *May take up 7 times daily as needed for anxiety   04/29/2014 at Unknown time  . esomeprazole (NEXIUM) 20 MG capsule Take 20 mg by mouth daily as needed (for acid reflux). Patient states that she has to open capsule and mix with applesauce to take   Past Week at Unknown time  . insulin NPH (HUMULIN N) 100 UNIT/ML injection Inject 10 Units into the skin 2 (two) times daily as needed. Use as directed based on sliding scale instructions   04/29/2014 at Unknown time  . promethazine (PHENERGAN) 25 MG tablet Take 25 mg by mouth 4 (four) times daily.    04/29/2014 at Unknown time  . aspirin 81 MG EC tablet Take 81 mg by mouth daily.     Taking  . diphenhydrAMINE (BENADRYL) 25 MG tablet Take 25 mg by mouth every 6 (six) hours as needed for allergies.    unknown  . HUMULIN R 100 UNIT/ML injection Inject 2 Units into the skin 2 (two) times daily as needed for high blood sugar.    unknown  . levothyroxine (SYNTHROID, LEVOTHROID) 50 MCG tablet Take 1 1/2 tabs daily     . nitroGLYCERIN (NITROSTAT) 0.4 MG SL tablet Place 0.4 mg under the tongue every 5 (five) minutes as needed.     unknown  . predniSONE (DELTASONE) 5 MG tablet Take 1 tablet by mouth as needed (for allergic reactions).    unknown  . TOPROL XL 25 MG 24 hr tablet TAKE (1) TABLET ONCE DAILY AS NEEDED. 30 each 6 Taking   Scheduled: . feeding supplement (GLUCERNA SHAKE)  237 mL Oral TID BM  . insulin aspart  1-8 Units Subcutaneous Q4H  . insulin detemir  10 Units Subcutaneous Q lunch  . levofloxacin  250 mg Oral Daily  . multivitamin with minerals  1 tablet Oral Daily  . pantoprazole  40 mg Oral BID   Continuous:  CHE:NIDPOE chloride, ALPRAZolam, aspirin, diphenhydrAMINE,  ipratropium-albuterol, menthol-cetylpyridinium, ondansetron **OR** ondansetron (ZOFRAN) IV, promethazine, sodium chloride  Assesment:she was admitted with obstructive pneumonia. She's had trouble with her blood sugar and she has  delusional disorder. She has lung cancer which is not being treated  Principal Problem:   Postobstructive pneumonia Active Problems:   Malignant neoplasm of larynx   Chronic ischemic heart disease   S/P laryngectomy   Anxiety disorder   Chronic obstructive pulmonary disease   Lung mass   Cough with hemoptysis   Tracheostomy dependence   Protein-calorie malnutrition, severe   Delusional disorder   Diabetes mellitus type 2 with complications, uncontrolled    Plan:change her dosing on her medications for blood sugar. I think some of the problem with her blood sugar going from approximately 100 to approximately 500 is that she ate a candy bar    LOS: 6 days   [name] 05/05/2014, 10:59 AM

## 2014-05-05 NOTE — Progress Notes (Addendum)
Patient has a stage 2 on her sacrum.  I voiced to her that she needed let me place a dressing over it.  She said she was going to treat it and refused to let me place the patch over the wound.  I will try again later.   Late entry 1449 Patient had Betamethasone Cream in her room that was prescribed for her from home for her sacral wound. She stated that she would put the cream on and that she did not want a dressing on the site. She stated that it was to be open to air.

## 2014-05-05 NOTE — Progress Notes (Signed)
Pt very persistent throughout shift to have fluids infusing via IV therapy but did not have IV access at the beginning of the shift. Pt requested only that staff members Octavia Bruckner or Webb Silversmith attempt to gain IV access on her. Pt continued to ring for help & demanded that Tim come see her & start her IV & stated "I can't and will not go to sleep until I have my fluids going because without them I can't breathe." Pt has order to maintain IV access via NS up to 72mL/hr. This Probation officer spoke with pt about allowing this Probation officer to try to gain IV access & pt agreed. IV access started to RIGHT hand, 22G, pt tolerated well, infusing NS 39mL/hr as ordered at this time.

## 2014-05-05 NOTE — Progress Notes (Signed)
0433 CBG - 531, MD notified. New order to give Novolog 6 units SQ x 1 time only.

## 2014-05-06 LAB — GLUCOSE, CAPILLARY
GLUCOSE-CAPILLARY: 153 mg/dL — AB (ref 70–99)
GLUCOSE-CAPILLARY: 195 mg/dL — AB (ref 70–99)
GLUCOSE-CAPILLARY: 226 mg/dL — AB (ref 70–99)
GLUCOSE-CAPILLARY: 384 mg/dL — AB (ref 70–99)
Glucose-Capillary: 283 mg/dL — ABNORMAL HIGH (ref 70–99)
Glucose-Capillary: 276 mg/dL — ABNORMAL HIGH (ref 70–99)
Glucose-Capillary: 301 mg/dL — ABNORMAL HIGH (ref 70–99)

## 2014-05-06 MED ORDER — GLUCERNA SHAKE PO LIQD: 237.0000 mL | Freq: Three times a day (TID) | ORAL | Status: AC

## 2014-05-06 MED ORDER — LEVOFLOXACIN 250 MG PO TABS: 250.0000 mg | ORAL_TABLET | ORAL | Status: AC

## 2014-05-06 NOTE — Progress Notes (Signed)
Subjective: Her blood sugar has come under control now. She is still generally noncooperative with her care but has been taking her insulin as directed. She has no new complaints  Objective: Vital signs in last 24 hours: Temp:  [98.7 F (37.1 C)-99.1 F (37.3 C)] 99 F (37.2 C) (11/08 0600) Pulse Rate:  [83-104] 83 (11/08 0600) Resp:  [16] 16 (11/08 0600) BP: (98-107)/(42-60) 98/49 mmHg (11/08 0600) SpO2:  [94 %-100 %] 96 % (11/08 0732) Weight change:  Last BM Date: 05/04/14  Intake/Output from previous day: 11/07 0701 - 11/08 0700 In: -  Out: 1150 [Urine:1150]  PHYSICAL EXAM General appearance: alert and mild distress Resp: rhonchi bilaterally Cardio: regular rate and rhythm, S1, S2 normal, no murmur, click, rub or gallop GI: soft, non-tender; bowel sounds normal; no masses,  no organomegaly Extremities: extremities normal, atraumatic, no cyanosis or edema  Lab Results:  Results for orders placed or performed during the hospital encounter of 04/29/14 (from the past 48 hour(s))  Glucose, capillary     Status: Abnormal   Collection Time: 05/04/14 11:45 AM  Result Value Ref Range   Glucose-Capillary 421 (H) 70 - 99 mg/dL   Comment 1 Notify RN   Glucose, capillary     Status: Abnormal   Collection Time: 05/04/14  3:25 PM  Result Value Ref Range   Glucose-Capillary 314 (H) 70 - 99 mg/dL  Glucose, capillary     Status: Abnormal   Collection Time: 05/04/14  5:07 PM  Result Value Ref Range   Glucose-Capillary 314 (H) 70 - 99 mg/dL   Comment 1 Notify RN   Glucose, capillary     Status: Abnormal   Collection Time: 05/04/14  9:13 PM  Result Value Ref Range   Glucose-Capillary 277 (H) 70 - 99 mg/dL  Glucose, capillary     Status: Abnormal   Collection Time: 05/05/14 12:38 AM  Result Value Ref Range   Glucose-Capillary 114 (H) 70 - 99 mg/dL   Comment 1 Documented in Chart    Comment 2 Notify RN   Glucose, capillary     Status: Abnormal   Collection Time: 05/05/14  4:07 AM   Result Value Ref Range   Glucose-Capillary 531 (H) 70 - 99 mg/dL   Comment 1 Documented in Chart    Comment 2 Notify RN   Glucose, capillary     Status: Abnormal   Collection Time: 05/05/14  7:34 AM  Result Value Ref Range   Glucose-Capillary 439 (H) 70 - 99 mg/dL   Comment 1 Notify RN   Glucose, capillary     Status: Abnormal   Collection Time: 05/05/14 10:08 AM  Result Value Ref Range   Glucose-Capillary 437 (H) 70 - 99 mg/dL   Comment 1 Notify RN   Glucose, capillary     Status: Abnormal   Collection Time: 05/05/14  1:48 PM  Result Value Ref Range   Glucose-Capillary 331 (H) 70 - 99 mg/dL   Comment 1 Notify RN   Glucose, capillary     Status: Abnormal   Collection Time: 05/05/14  4:12 PM  Result Value Ref Range   Glucose-Capillary 238 (H) 70 - 99 mg/dL   Comment 1 Notify RN   Glucose, capillary     Status: Abnormal   Collection Time: 05/05/14  7:50 PM  Result Value Ref Range   Glucose-Capillary 115 (H) 70 - 99 mg/dL  Glucose, capillary     Status: Abnormal   Collection Time: 05/05/14 11:30 PM  Result Value  Ref Range   Glucose-Capillary 208 (H) 70 - 99 mg/dL   Comment 1 Documented in Chart    Comment 2 Notify RN   Glucose, capillary     Status: Abnormal   Collection Time: 05/06/14  3:44 AM  Result Value Ref Range   Glucose-Capillary 283 (H) 70 - 99 mg/dL   Comment 1 Documented in Chart    Comment 2 Notify RN   Glucose, capillary     Status: Abnormal   Collection Time: 05/06/14  7:33 AM  Result Value Ref Range   Glucose-Capillary 153 (H) 70 - 99 mg/dL   Comment 1 Notify RN   Glucose, capillary     Status: Abnormal   Collection Time: 05/06/14  8:30 AM  Result Value Ref Range   Glucose-Capillary 195 (H) 70 - 99 mg/dL   Comment 1 Notify RN   Glucose, capillary     Status: Abnormal   Collection Time: 05/06/14 10:26 AM  Result Value Ref Range   Glucose-Capillary 226 (H) 70 - 99 mg/dL    ABGS No results for input(s): PHART, PO2ART, TCO2, HCO3 in the last 72  hours.  Invalid input(s): PCO2 CULTURES No results found for this or any previous visit (from the past 240 hour(s)). Studies/Results: No results found.  Medications:  Prior to Admission:  Prescriptions prior to admission  Medication Sig Dispense Refill Last Dose  . ALPRAZolam (XANAX) 0.5 MG tablet Take 0.5 mg by mouth 6 (six) times daily. *May take up 7 times daily as needed for anxiety   04/29/2014 at Unknown time  . esomeprazole (NEXIUM) 20 MG capsule Take 20 mg by mouth daily as needed (for acid reflux). Patient states that she has to open capsule and mix with applesauce to take   Past Week at Unknown time  . insulin NPH (HUMULIN N) 100 UNIT/ML injection Inject 10 Units into the skin 2 (two) times daily as needed. Use as directed based on sliding scale instructions   04/29/2014 at Unknown time  . promethazine (PHENERGAN) 25 MG tablet Take 25 mg by mouth 4 (four) times daily.    04/29/2014 at Unknown time  . aspirin 81 MG EC tablet Take 81 mg by mouth daily.     Taking  . diphenhydrAMINE (BENADRYL) 25 MG tablet Take 25 mg by mouth every 6 (six) hours as needed for allergies.    unknown  . HUMULIN R 100 UNIT/ML injection Inject 2 Units into the skin 2 (two) times daily as needed for high blood sugar.    unknown  . levothyroxine (SYNTHROID, LEVOTHROID) 50 MCG tablet Take 1 1/2 tabs daily     . nitroGLYCERIN (NITROSTAT) 0.4 MG SL tablet Place 0.4 mg under the tongue every 5 (five) minutes as needed.     unknown  . predniSONE (DELTASONE) 5 MG tablet Take 1 tablet by mouth as needed (for allergic reactions).    unknown  . TOPROL XL 25 MG 24 hr tablet TAKE (1) TABLET ONCE DAILY AS NEEDED. 30 each 6 Taking   Scheduled: . feeding supplement (GLUCERNA SHAKE)  237 mL Oral TID BM  . insulin aspart  1-8 Units Subcutaneous Q4H  . insulin detemir  10 Units Subcutaneous Q lunch  . levofloxacin  250 mg Oral Daily  . multivitamin with minerals  1 tablet Oral Daily  . pantoprazole  40 mg Oral BID    Continuous:  PPI:RJJOAC chloride, ALPRAZolam, aspirin, diphenhydrAMINE, ipratropium-albuterol, menthol-cetylpyridinium, ondansetron **OR** ondansetron (ZOFRAN) IV, promethazine, sodium chloride  Assesment:she was admitted  with obstructive pneumonia. She is known to have lung cancer which is not being treated. She has previous episodes of being treated with radiation but does not want any treatment now. Her pneumonia is better. She has severe protein calorie malnutrition which is multi-factorial. She has diabetes which is not controlled but is much better now. She has delusional disorder but is not felt to need inpatient care. I told her she has reached maximum hospital benefit and I do not have a reason that she needs to stay in the hospital now that her blood sugar is better controlled. She says she cannot be discharged because she doesn't have a way home.  Principal Problem:   Postobstructive pneumonia Active Problems:   Malignant neoplasm of larynx   Chronic ischemic heart disease   S/P laryngectomy   Anxiety disorder   Chronic obstructive pulmonary disease   Lung mass   Cough with hemoptysis   Tracheostomy dependence   Protein-calorie malnutrition, severe   Delusional disorder   Diabetes mellitus type 2 with complications, uncontrolled    Plan:discharge home and see if we can come up with a way to get her home    LOS: 7 days   Shellia Hartl L 05/06/2014, 11:14 AM

## 2014-05-06 NOTE — Progress Notes (Signed)
Talked with patient's friend, Lovey Newcomer, that says she cannot come pick her up this afternoon and it would be later tomorrow before she can.  Patient also stated that she does not have keys to her house if we were to get her a ride home.  Notified AC, Alethia Berthold.  She is going to try to call patient's neighbor regarding the patient's house keys.

## 2014-05-06 NOTE — Progress Notes (Signed)
On two occasions it was reported by dietary staff the patient asked her once to bring her 2 boost so that she could  "run her sugar up." and the second time to bring her 3 packets of sugar.  It was also found  that she had some medications in her room that the aide stated that she saw her administer herself a pill of some sort.  The pills were taken from her room and will given back at discharge

## 2014-05-06 NOTE — Discharge Summary (Signed)
Physician Discharge Summary  Patient ID: [name] MRN: [id number] DOB/AGE: 12-22-1950 63 y.o. Primary Care Physician:Verneda Hollopeter L, MD Admit date: 04/29/2014 Discharge date: 05/06/2014    Discharge Diagnoses:   Principal Problem:   Postobstructive pneumonia Active Problems:   Malignant neoplasm of larynx   Chronic ischemic heart disease   S/P laryngectomy   Anxiety disorder   Chronic obstructive pulmonary disease   Lung mass   Cough with hemoptysis   Tracheostomy dependence   Protein-calorie malnutrition, severe   Delusional disorder   Diabetes mellitus type 2 with complications, uncontrolled     Medication List    TAKE these medications        ALPRAZolam 0.5 MG tablet  Commonly known as:  XANAX  Take 0.5 mg by mouth 6 (six) times daily. *May take up 7 times daily as needed for anxiety     aspirin 81 MG EC tablet  Take 81 mg by mouth daily.     diphenhydrAMINE 25 MG tablet  Commonly known as:  BENADRYL  Take 25 mg by mouth every 6 (six) hours as needed for allergies.     feeding supplement (GLUCERNA SHAKE) Liqd  Take 237 mLs by mouth 3 (three) times daily between meals.     HUMULIN N 100 UNIT/ML injection  Generic drug:  insulin NPH Human  Inject 10 Units into the skin 2 (two) times daily as needed. Use as directed based on sliding scale instructions     HUMULIN R 100 units/mL injection  Generic drug:  insulin regular  Inject 2 Units into the skin 2 (two) times daily as needed for high blood sugar.     ipratropium-albuterol 0.5-2.5 (3) MG/3ML Soln  Commonly known as:  DUONEB  Take 3 mLs by nebulization every 6 (six) hours as needed.     levofloxacin 250 MG tablet  Commonly known as:  LEVAQUIN  Take 1 tablet (250 mg total) by mouth every other day.     levothyroxine 50 MCG tablet  Commonly known as:  SYNTHROID, LEVOTHROID  Take 1 1/2 tabs daily     menthol-cetylpyridinium 3 MG lozenge  Commonly known as:  CEPACOL  Take 1 lozenge (3 mg  total) by mouth as needed for sore throat.     multivitamin with minerals Tabs tablet  Take 1 tablet by mouth daily.     NEXIUM 20 MG capsule  Generic drug:  esomeprazole  Take 20 mg by mouth daily as needed (for acid reflux). Patient states that she has to open capsule and mix with applesauce to take     nitroGLYCERIN 0.4 MG SL tablet  Commonly known as:  NITROSTAT  Place 0.4 mg under the tongue every 5 (five) minutes as needed.     predniSONE 5 MG tablet  Commonly known as:  DELTASONE  Take 1 tablet by mouth as needed (for allergic reactions).     promethazine 25 MG tablet  Commonly known as:  PHENERGAN  Take 25 mg by mouth 4 (four) times daily.     TOPROL XL 25 MG 24 hr tablet  Generic drug:  metoprolol succinate  TAKE (1) TABLET ONCE DAILY AS NEEDED.        Discharged Condition:improved    Consults:Tele psychiatry  Significant Diagnostic Studies: Ct Head W Wo Contrast  04/30/2014   CLINICAL DATA:  Personal history of laryngeal cancer. Paranoid ideation.  EXAM: CT HEAD WITHOUT AND WITH CONTRAST  TECHNIQUE: Contiguous axial images were obtained from the base of the skull through  the vertex without and with intravenous contrast  CONTRAST:  91mL OMNIPAQUE IOHEXOL 300 MG/ML  SOLN  COMPARISON:  05/18/2006  FINDINGS: The brain has normal appearance without evidence of old or acute infarction, mass lesion, hemorrhage, hydrocephalus or extra-axial collection. After contrast administration, no abnormal enhancement occurs. The calvarium is unremarkable. Visualized sinuses, middle ears and mastoids are clear. There is atherosclerotic calcification of the major vessels at the base of the brain.  IMPRESSION: No acute or focal finding. Atherosclerosis of the vessels at the base of the brain.   Electronically Signed   By: Nelson Chimes M.D.   On: 04/30/2014 17:28   Ct Chest W Contrast  04/29/2014   CLINICAL DATA:  Cough with spitting up blood and sputum for 3 days.  EXAM: CT CHEST WITH  CONTRAST  TECHNIQUE: Multidetector CT imaging of the chest was performed during intravenous contrast administration.  CONTRAST:  36mL OMNIPAQUE IOHEXOL 300 MG/ML  SOLN  COMPARISON:  06/12/2013  FINDINGS: Spiculated mass in the right apex measuring 2 x 2.7 cm. This is enlarging since the previous study. The previous cavitary focus has resolved or filled in over the interval. Appearance is worrisome for neoplasm. Consolidation in the right lower lobe posteriorly similar previous study. This is worrisome for a centrally obstructing mass lesion with postobstructive pneumonia. There is an enlarging thin-walled cavitary lesion in the posterior inferior right upper lung common now measuring 2.1 cm diameter, compared with 8 mm previously. Left lung appears clear and expanded. Emphysematous changes in both lungs. No pleural effusion. No pneumothorax. No significant lymphadenopathy in the chest.  Normal heart size. Normal caliber thoracic aorta. The great vessel origins are patent. The esophagus is diffusely dilated and partially fluid filled. The visualize stomach is also distended in this may be due to reflux although dysmotility or distal obstructing lesion are not excluded. Visualized upper abdominal organs are grossly unremarkable. Degenerative changes in the spine. No destructive bone lesions appreciated.  IMPRESSION: Enlarging spiculated mass in the right lung apex. Enlarging cavitary mass in the inferior right upper lung. Persistent consolidation in the right lower lobe. Changes are most likely represent metastatic disease. Underlying diffuse emphysema.   Electronically Signed   By: Lucienne Capers M.D.   On: 04/29/2014 22:47   Dg Abd Acute W/chest  04/29/2014   CLINICAL DATA:  Bloody vomiting.  Sick for the past 3 days.  EXAM: ACUTE ABDOMEN SERIES (ABDOMEN 2 VIEW & CHEST 1 VIEW)  COMPARISON:  Chest CT 06/12/2013 and chest radiograph 02/28/2012  FINDINGS: There is a rounded opacity at the right lung base which is  indeterminate. There were densities in this region on the previous imaging. Left lung is clear. Heart size is normal. No evidence for free air. Gas-filled loops of bowel in the abdomen appear to represent the stomach and colon. Moderate amount of stool in the pelvis.  IMPRESSION: Rounded opacity at the right lung base is indeterminate. This could represent chronic changes but cannot exclude a mass lesion in this area. This area could be better characterized with CT. These results were called by telephone at the time of interpretation on 04/29/2014 at 9:12 pm to Dr. Milton Ferguson , who verbally acknowledged these results.  Nonspecific bowel gas pattern.   Electronically Signed   By: Markus Daft M.D.   On: 04/29/2014 21:12    Lab Results: Basic Metabolic Panel: No results for input(s): NA, K, CL, CO2, GLUCOSE, BUN, CREATININE, CALCIUM, MG, PHOS in the last 72 hours. Liver Function Tests:  No results for input(s): AST, ALT, ALKPHOS, BILITOT, PROT, ALBUMIN in the last 72 hours.   CBC: No results for input(s): WBC, NEUTROABS, HGB, HCT, MCV, PLT in the last 72 hours.  No results found for this or any previous visit (from the past 240 hour(s)).   Hospital Course: this is a 63 year old who has multiple medical problems including previous laryngeal cancer requiring laryngectomy, lung cancer which has been treated with radiation and is now under no treatment, COPD, severe diabetes which has been under poor control coronary artery occlusive disease. She came to the emergency department with shortness of breath but then said that the main reason she was there was because her house had to be fumigated. She apparently has delusions that she has parasites there being placed in her body by rodents. She had tele-psychiatry consultation and initially was felt to need inpatient care but she improved and had repeat psychiatry consultation and it was felt that she could be managed as an outpatient. Her blood sugar was  difficult to get down and she is very brittle at her best. Unfortunately although she checks her blood sugar frequently she rarely takes insulin and manages low blood sugars with candy bars. She considers about 200 to low blood sugar. At any rate we got her blood sugar under control and she was at baseline as far as her COPD and shortness of breath is concerned. She is at maximum hospital benefit and ready for discharge  Discharge Exam: Blood pressure 98/49, pulse 83, temperature 99 F (37.2 C), temperature source Oral, resp. rate 16, height 5' (1.524 m), weight 37.2 kg (82 lb 0.2 oz), SpO2 96 %. She has a permanent tracheostomy. Her chest is pretty clear. Her heart is regular.  Disposition: home with home health services. She will take an antibiotic at home. She says she's not going to change how she does her insulin at home. She has follow-up appointment with psychiatry.      Discharge Instructions    Discharge patient    Complete by:  As directed      Face-to-face encounter (required for Medicare/Medicaid patients)    Complete by:  As directed   I Avianah Pellman L certify that this patient is under my care and that I, or a nurse practitioner or physician's assistant working with me, had a face-to-face encounter that meets the physician face-to-face encounter requirements with this patient on 05/06/2014. The encounter with the patient was in whole, or in part for the following medical condition(s) which is the primary reason for home health care (List medical condition): delusional disorder/obstructive pneumonia/uncontrolled diabetes  The encounter with the patient was in whole, or in part, for the following medical condition, which is the primary reason for home health care:  delusional disorder/obstructive pneumonia/uncontrolled diabetes  I certify that, based on my findings, the following services are medically necessary home health services:   NursingPhysical therapy    Reason for Medically  Necessary Home Health Services:  Skilled Nursing- Change/Decline in Patient Status  My clinical findings support the need for the above services:  Shortness of breath with activity  Further, I certify that my clinical findings support that this patient is homebound due to:  Shortness of Breath with activity     Home Health    Complete by:  As directed   To provide the following care/treatments:   [facility]             Follow-up Information    Follow up with  [facility] On 05/08/2014.   Why:  11:30   Contact information:   [address] [phone]      Signed: Alonza Bogus   05/06/2014, 11:32 AM

## 2014-05-07 LAB — GLUCOSE, CAPILLARY
GLUCOSE-CAPILLARY: 409 mg/dL — AB (ref 70–99)
Glucose-Capillary: 369 mg/dL — ABNORMAL HIGH (ref 70–99)

## 2014-05-07 NOTE — Care Management Utilization Note (Signed)
UR review complete.  

## 2014-07-30 DEATH — deceased

## 2016-07-31 ENCOUNTER — Encounter: Payer: Self-pay | Admitting: Internal Medicine
